# Patient Record
Sex: Female | Born: 1995 | Race: White | Hispanic: No | Marital: Married | Smoking: Never smoker
Health system: Southern US, Community
[De-identification: ages and names within clinical notes are randomized; demographics above are authoritative.]

## PROBLEM LIST (undated history)

## (undated) ENCOUNTER — Inpatient Hospital Stay (HOSPITAL_COMMUNITY): Payer: Self-pay

## (undated) DIAGNOSIS — Z23 Encounter for immunization: Secondary | ICD-10-CM

## (undated) DIAGNOSIS — A159 Respiratory tuberculosis unspecified: Secondary | ICD-10-CM

## (undated) DIAGNOSIS — R519 Headache, unspecified: Secondary | ICD-10-CM

## (undated) DIAGNOSIS — N83209 Unspecified ovarian cyst, unspecified side: Secondary | ICD-10-CM

## (undated) DIAGNOSIS — Z8041 Family history of malignant neoplasm of ovary: Secondary | ICD-10-CM

## (undated) DIAGNOSIS — B999 Unspecified infectious disease: Secondary | ICD-10-CM

## (undated) DIAGNOSIS — D649 Anemia, unspecified: Secondary | ICD-10-CM

## (undated) DIAGNOSIS — N750 Cyst of Bartholin's gland: Secondary | ICD-10-CM

## (undated) DIAGNOSIS — Z227 Latent tuberculosis: Secondary | ICD-10-CM

## (undated) DIAGNOSIS — R51 Headache: Secondary | ICD-10-CM

## (undated) DIAGNOSIS — N200 Calculus of kidney: Secondary | ICD-10-CM

## (undated) DIAGNOSIS — J45909 Unspecified asthma, uncomplicated: Secondary | ICD-10-CM

## (undated) HISTORY — PX: NO PAST SURGERIES: SHX2092

## (undated) HISTORY — DX: Family history of malignant neoplasm of ovary: Z80.41

## (undated) HISTORY — DX: Cyst of Bartholin's gland: N75.0

## (undated) HISTORY — DX: Unspecified ovarian cyst, unspecified side: N83.209

## (undated) HISTORY — DX: Encounter for immunization: Z23

---
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
## 2015-01-16 ENCOUNTER — Other Ambulatory Visit: Payer: Self-pay | Admitting: Occupational Medicine

## 2015-01-16 ENCOUNTER — Ambulatory Visit
Admission: RE | Admit: 2015-01-16 | Discharge: 2015-01-16 | Disposition: A | Discharge status: Home / Self Care | Payer: Self-pay | Source: Ambulatory Visit | Attending: Occupational Medicine | Admitting: Occupational Medicine

## 2015-01-16 DIAGNOSIS — R7611 Nonspecific reaction to tuberculin skin test without active tuberculosis: Secondary | ICD-10-CM

## 2016-03-20 ENCOUNTER — Encounter (HOSPITAL_BASED_OUTPATIENT_CLINIC_OR_DEPARTMENT_OTHER): Payer: Self-pay | Admitting: *Deleted

## 2016-03-20 ENCOUNTER — Emergency Department (HOSPITAL_BASED_OUTPATIENT_CLINIC_OR_DEPARTMENT_OTHER): Payer: BLUE CROSS/BLUE SHIELD

## 2016-03-20 ENCOUNTER — Emergency Department (HOSPITAL_BASED_OUTPATIENT_CLINIC_OR_DEPARTMENT_OTHER)
Admission: EM | Admit: 2016-03-20 | Discharge: 2016-03-20 | Disposition: A | Discharge status: Home / Self Care | Payer: BLUE CROSS/BLUE SHIELD | Attending: Physician Assistant | Admitting: Physician Assistant

## 2016-03-20 DIAGNOSIS — R079 Chest pain, unspecified: Secondary | ICD-10-CM | POA: Insufficient documentation

## 2016-03-20 DIAGNOSIS — R072 Precordial pain: Secondary | ICD-10-CM | POA: Diagnosis present

## 2016-03-20 LAB — COMPREHENSIVE METABOLIC PANEL
ALK PHOS: 51 U/L (ref 38–126)
ALT: 13 U/L — ABNORMAL LOW (ref 14–54)
ANION GAP: 5 (ref 5–15)
AST: 16 U/L (ref 15–41)
Albumin: 4.2 g/dL (ref 3.5–5.0)
BILIRUBIN TOTAL: 0.3 mg/dL (ref 0.3–1.2)
BUN: 14 mg/dL (ref 6–20)
CALCIUM: 9.1 mg/dL (ref 8.9–10.3)
CO2: 28 mmol/L (ref 22–32)
Chloride: 105 mmol/L (ref 101–111)
Creatinine, Ser: 0.67 mg/dL (ref 0.44–1.00)
GFR calc non Af Amer: >60 mL/min (ref >60)
Glucose, Bld: 105 mg/dL — ABNORMAL HIGH (ref 65–99)
POTASSIUM: 4.6 mmol/L (ref 3.5–5.1)
SODIUM: 138 mmol/L (ref 135–145)
TOTAL PROTEIN: 8 g/dL (ref 6.5–8.1)

## 2016-03-20 LAB — CBC WITH DIFFERENTIAL/PLATELET
BASOS ABS: 0 10*3/uL (ref 0.0–0.1)
BASOS PCT: 0 %
Eosinophils Absolute: 0.2 10*3/uL (ref 0.0–0.7)
Eosinophils Relative: 2 %
HEMATOCRIT: 41.2 % (ref 36.0–46.0)
HEMOGLOBIN: 14.2 g/dL (ref 12.0–15.0)
LYMPHS PCT: 29 %
Lymphs Abs: 3.2 10*3/uL (ref 0.7–4.0)
MCH: 30.3 pg (ref 26.0–34.0)
MCHC: 34.5 g/dL (ref 30.0–36.0)
MCV: 87.8 fL (ref 78.0–100.0)
Monocytes Absolute: 0.9 10*3/uL (ref 0.1–1.0)
Monocytes Relative: 8 %
NEUTROS ABS: 6.6 10*3/uL (ref 1.7–7.7)
NEUTROS PCT: 61 %
Platelets: 354 10*3/uL (ref 150–400)
RBC: 4.69 MIL/uL (ref 3.87–5.11)
RDW: 13.2 % (ref 11.5–15.5)
WBC: 10.9 10*3/uL — ABNORMAL HIGH (ref 4.0–10.5)

## 2016-03-20 LAB — D-DIMER, QUANTITATIVE (NOT AT ARMC)

## 2016-03-20 LAB — HCG, SERUM, QUALITATIVE: Preg, Serum: NEGATIVE

## 2016-03-20 NOTE — Discharge Instructions (Signed)
You were seen here for shortness of breath and chest pain. All the labs and EKG are are reassuring. Please follow-up with her primary care physician for other concerns.

## 2016-03-20 NOTE — ED Triage Notes (Signed)
Pt c/o med sternal CP and dizziness x 2 days.

## 2016-03-20 NOTE — ED Notes (Signed)
Patient transported to X-ray 

## 2016-03-20 NOTE — ED Provider Notes (Signed)
[facility]   CSN: [id number] Arrival date & time: 03/20/16  1616     History   Chief Complaint Chief Complaint  Patient presents with  . Chest Pain    HPI Lynann BeaverSarah Leanne Friedlander is a 20 y.o. female.  The history is provided by the patient.  Chest Pain   This is a new problem. The current episode started 2 days ago. Episode frequency: intermittently. The problem has not changed since onset.The pain is associated with exertion and movement. The pain is present in the substernal region. The pain is at a severity of 2/10. The pain is mild. The quality of the pain is described as brief, pleuritic and heavy. The pain does not radiate. The symptoms are aggravated by certain positions and deep breathing. Pertinent negatives include no diaphoresis, no dizziness, no near-syncope, no numbness and no palpitations. She has tried nothing for the symptoms. The treatment provided mild relief. There are no known risk factors.  Pertinent negatives for past medical history include no anxiety/panic attacks, no CAD, no cancer, no congenital heart disease, no diabetes, no DVT, no MI, no PE, no PVD and no recent injury.  Pertinent negatives for family medical history include: no CAD and no early MI.    History reviewed. No pertinent past medical history.  There are no active problems to display for this patient.   History reviewed. No pertinent surgical history.  OB History    No data available       Home Medications    Prior to Admission medications   Not on File    Family History History reviewed. No pertinent family history.  Social History Social History  Substance Use Topics  . Smoking status: Never Smoker  . Smokeless tobacco: Not on file  . Alcohol use No     Allergies   Patient has no known allergies.   Review of Systems Review of Systems  Constitutional: Negative for diaphoresis.  Cardiovascular: Positive for chest pain. Negative for  palpitations and near-syncope.  Neurological: Negative for dizziness and numbness.  All other systems reviewed and are negative.    Physical Exam Updated Vital Signs BP 151/83   Pulse 104   Temp 97.7 F (36.5 C)   Resp 18   Ht 5\' 6"  (1.676 m)   Wt 150 lb (68 kg)   LMP 02/29/2016   SpO2 100%   BMI 24.21 kg/m   Physical Exam  Constitutional: She is oriented to person, place, and time. She appears well-developed and well-nourished.  HENT:  Head: Normocephalic and atraumatic.  Eyes: Right eye exhibits no discharge.  Cardiovascular: Normal rate, regular rhythm and normal heart sounds.   No murmur heard. Pulmonary/Chest: Effort normal and breath sounds normal. She has no wheezes. She has no rales.  Abdominal: Soft. She exhibits no distension. There is no tenderness.  Neurological: She is oriented to person, place, and time.  Skin: Skin is warm and dry. She is not diaphoretic.  Psychiatric: She has a normal mood and affect.  Nursing note and vitals reviewed.    ED Treatments / Results  Labs (all labs ordered are listed, but only abnormal results are displayed) Labs Reviewed  COMPREHENSIVE METABOLIC PANEL  CBC WITH DIFFERENTIAL/PLATELET  HCG, SERUM, QUALITATIVE  D-DIMER, QUANTITATIVE (NOT [facility])    EKG  EKG Interpretation None       Radiology No results found.  Procedures Procedures (including critical care time)  Medications Ordered in ED Medications - No data to  display   Initial Impression / Assessment and Plan / ED Course  I have reviewed the triage vital signs and the nursing notes.  Pertinent labs & imaging results that were available during my care of the patient were reviewed by me and considered in my medical decision making (see chart for details).  Clinical Course     Pt is a 20 year old presenting with SOB  intermittently ocassionally since yesterday. Negative review of  symptoms. No recent travel, no OCP. No PE risk factors. However  patient initially tachycardic. We'll do d-dimer. Do not suspect ischemic heart heart disease that she has 0 risk factors and a young age. Will get chest x-ray and labs.  Normal vital signs labs and xray reassuring. Plan to discharge with follow up with PCP.  Final Clinical Impressions(s) / ED Diagnoses   Final diagnoses:  None    New Prescriptions New Prescriptions   No medications on file     Hosea Hanawalt Randall AnLyn Gerilyn Stargell, MD 03/20/16 1928

## 2016-07-12 IMAGING — CR DG CHEST 1V
1 series · 1 of 1 positions shown · non-contrast
Comparison: None.

CLINICAL DATA: Positive tuberculin skin test

EXAM:
CHEST  1 VIEW

[chest pa]
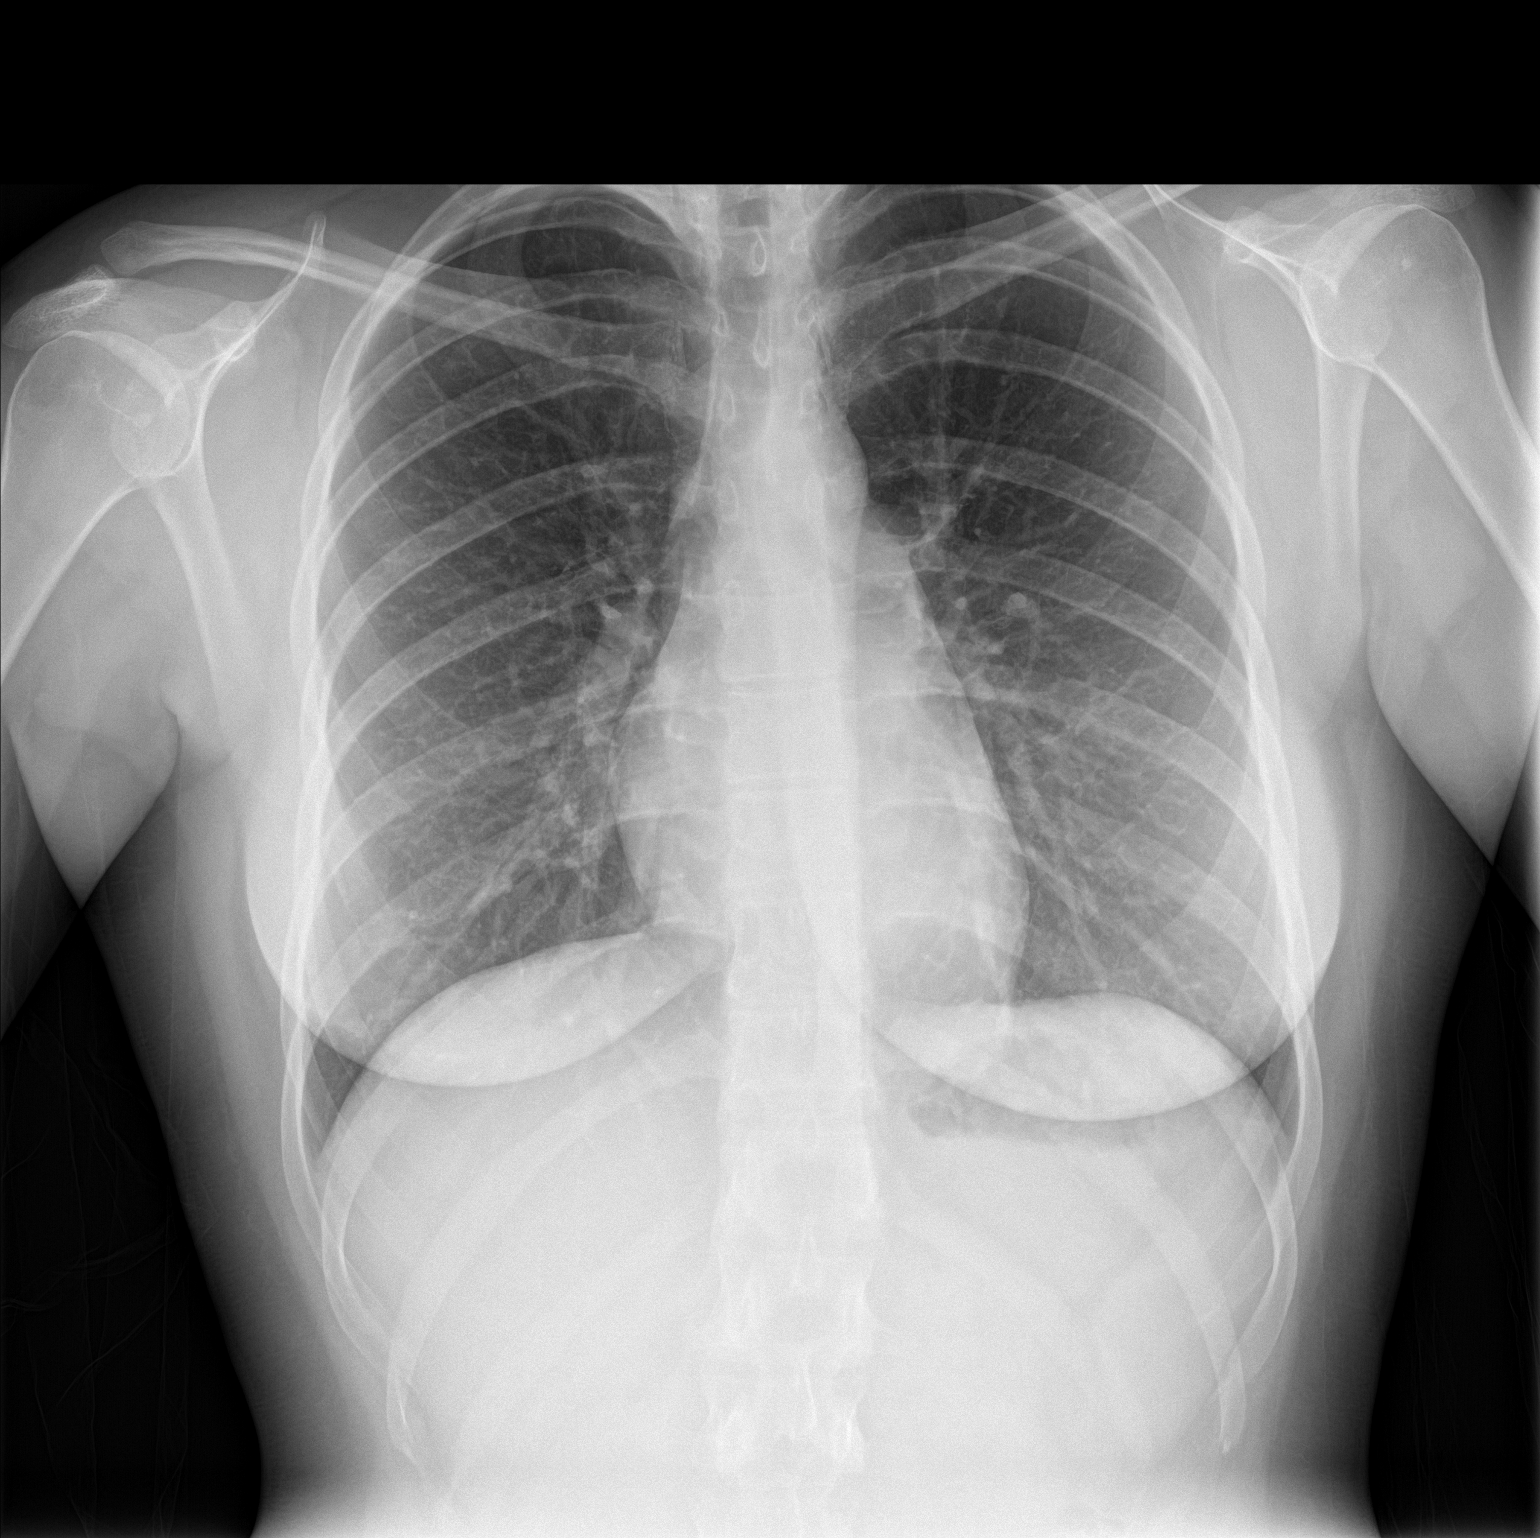

[1 of 1 positions shown; findings below may reference images not displayed]

FINDINGS: Lungs are clear. The heart size and pulmonary vascularity are
normal. No adenopathy. No bone lesions.
IMPRESSION: No abnormality noted.

## 2016-08-14 ENCOUNTER — Ambulatory Visit: Payer: Self-pay | Admitting: Obstetrics and Gynecology

## 2016-08-21 ENCOUNTER — Encounter: Payer: Managed Care, Other (non HMO) | Admitting: Obstetrics and Gynecology

## 2016-08-21 ENCOUNTER — Encounter: Payer: Self-pay | Admitting: Obstetrics and Gynecology

## 2016-08-22 NOTE — Progress Notes (Signed)
This encounter was created in error - please disregard.

## 2016-09-13 ENCOUNTER — Ambulatory Visit: Payer: Managed Care, Other (non HMO) | Admitting: Obstetrics and Gynecology

## 2016-09-13 NOTE — Progress Notes (Deleted)
   No chief complaint on file.    HPI:      [name] is a 21 y.o. G0P0000 who LMP was No LMP recorded., presents today for her NP annual examination.  Her menses are {norm/abn:715}, lasting {number:22536} days.  Dysmenorrhea {dysmen:716}. She {does:18564} have intermenstrual bleeding.  Sex activity: {sex active:315163}.  Hx of STDs: {STD hx:14358}  There is no FH of breast cancer. There is no FH of ovarian cancer. The patient {does:18564} do self-breast exams.  Tobacco use: {tob:20664} Alcohol use: {Alcohol:11675} Exercise: {exercise:31265}  She {does:18564} get adequate calcium and Vitamin D in her diet.  Gardasil series completed.  Past Medical History:  Diagnosis Date  . Family history of ovarian cancer    PT CANDIDATE FOR GENETIC TESTING AT AGE 32  . Immunization, viral disease    GARDASIL COMPLETED  . Ovarian cyst 05/2012   RUPTURED; SAW URGENT CARE    No past surgical history on file.  Family History  Problem Relation Age of Onset  . Cancer Paternal Grandmother     Social History   Social History  . Marital status: Single    Spouse name: N/A  . Number of children: N/A  . Years of education: N/A   Occupational History  . Not on file.   Social History Main Topics  . Smoking status: Never Smoker  . Smokeless tobacco: Never Used  . Alcohol use No  . Drug use: No  . Sexual activity: Yes    Birth control/ protection: None, Pill   Other Topics Concern  . Not on file   Social History Narrative  . No narrative on file     Current Outpatient Prescriptions:  .  cephALEXin (KEFLEX) 500 MG capsule, , Disp: , Rfl:  .  norethindrone-ethinyl estradiol (MICROGESTIN FE 1/20) 1-20 MG-MCG tablet, Take 1 tablet by mouth daily., Disp: , Rfl:   ROS:  ROS  Objective: There were no vitals taken for this visit.   OBGyn Exam  Results: No results found for this or any previous visit (from the past 24 hour(s)).  Assessment/Plan: No  diagnosis found.             GYN counsel {counseling:16159}     F/U  No Follow-up on file.  Alicia B. Copland, PA-C 09/13/2016 8:54 AM

## 2017-05-06 NOTE — L&D Delivery Note (Signed)
[facility] Delivery Note  Melanie BeaverSarah Leanne Meadows is a 22 y.o. G2P0010 s/p SVD at 656w6d. She was admitted for PROM.   ROM: 22h 7864m with clear fluid GBS Status: Negative Maximum Maternal Temperature: 98.4  Labor Progress: . Augmented with cytotec and FB   Delivery Date/Time: [phone] Delivery: Called to room and patient was complete and pushing. Head delivered ROA. Nuchal cord present x1. Shoulder and body delivered in usual fashion. Infant with spontaneous cry, placed on mother's abdomen, dried and stimulated. Cord clamped x 2 after 1-minute delay, and cut by FOB. Cord blood drawn. Placenta delivered spontaneously with gentle cord traction. Fundus firm with massage and Pitocin. Labia, perineum, vagina, and cervix inspected inspected with R labial laceration noted, one 3.0 Monocryl interrupted stich placed with hemostasis afterwards.   Placenta: intact Complications: none Lacerations: R labial EBL: 467ml  Infant: vigorous female  APGARs 679 and 559  3225g  Burman NievesJulia Vernor Monnig, MD Family Medicine Resident

## 2017-07-24 ENCOUNTER — Ambulatory Visit (INDEPENDENT_AMBULATORY_CARE_PROVIDER_SITE_OTHER): Payer: Managed Care, Other (non HMO) | Admitting: *Deleted

## 2017-07-24 DIAGNOSIS — Z3201 Encounter for pregnancy test, result positive: Secondary | ICD-10-CM

## 2017-07-24 DIAGNOSIS — Z3687 Encounter for antenatal screening for uncertain dates: Secondary | ICD-10-CM

## 2017-07-24 NOTE — Progress Notes (Signed)
PRENATAL INTAKE SUMMARY  Melanie Meadows presents today New OB Nurse Interview.  OB History    Gravida  2   Para  0   Term  0   Preterm  0   AB  1   Living  0     SAB  1   TAB  0   Ectopic  0   Multiple  0   Live Births  0          I have reviewed the patient's medical, obstetrical, social, and family histories, medications, and available lab results.  SUBJECTIVE She has no unusual complaints  OBJECTIVE Initial Physical Exam (New OB)  GENERAL APPEARANCE: alert, well appearing, in no apparent distress, oriented to person, place and time   ASSESSMENT Positive UPT and Gestational Sac seen on bedside US Early Pregnancy   PL[facility] If viability confirmed on next bedside US will order New OB baseline labs Pt to return in 1 week for viability scan    DATING AND VIABILITY SONOGRAM   Melanie Meadows is a 22 y.o. year old G2P0010 with LMP Patient's last menstrual period was 06/20/2017. which would correlate to  5932w6d weeks gestation.  She has regular menstrual cycles.   She is here today for a confirmatory initial sonogram.    GESTATION: Undertermined     FETAL ACTIVITY: Heart rate: Not seen          The fetus is Not Seen.  GESTATIONAL AGE AND  BIOMETRICS:  Gestational criteria: Estimated Date of Delivery: 03/27/18 by LMP now at 6732w6d  Previous Scans:0  GESTATIONAL SAC           0.397cm 5.0 wks      AVERAGE EGA(BY THIS SCAN):  5.0 weeks  WORKING EDD( LMP ):  03/27/18     TECHNICIAN COMMENTS:  Gestational Sac measuring 5.0 wks. Pt is to come back in 1 week for another viability scan  [facility] Aleeta Schmaltz 07/24/2017 10:34 AM

## 2017-07-29 ENCOUNTER — Encounter: Payer: Self-pay | Admitting: Obstetrics & Gynecology

## 2017-07-31 ENCOUNTER — Ambulatory Visit: Payer: Managed Care, Other (non HMO) | Admitting: *Deleted

## 2017-07-31 DIAGNOSIS — Z3201 Encounter for pregnancy test, result positive: Secondary | ICD-10-CM

## 2017-07-31 DIAGNOSIS — Z3480 Encounter for supervision of other normal pregnancy, unspecified trimester: Secondary | ICD-10-CM

## 2017-07-31 NOTE — Progress Notes (Signed)
Patient presented to office today for viability scan and lab work. Ultrasound was perform and heart rate of baby was 114. Patient is requesting we do a blood test(hcg) level since she has not had one with this pregnancy and has a history of having miscarriage. Patient does not want any generic testing or the ob panel today due to cost and would prefer to wait a couple more weeks.Labs were canceled and hcg was ordered. Advised patient we will call her once the results come back. Patient voice understanding.

## 2017-08-01 ENCOUNTER — Encounter: Payer: Self-pay | Admitting: *Deleted

## 2017-08-01 LAB — BETA HCG QUANT (REF LAB): HCG QUANT: 11125 m[IU]/mL

## 2017-08-01 NOTE — Progress Notes (Signed)
Pt came in office 07/31/17 for viability scan.  DATING AND VIABILITY SONOGRAM   Melanie Meadows is a 22 y.o. year old G2P0010 with LMP 06/20/2017 which would correlate to  6361w0d weeks gestation.  She has regular menstrual cycles.   She is here today for a confirmatory initial sonogram.    GESTATION: SINGLETON     FETAL ACTIVITY:          Heart rate         114bpm          The fetus is current.    GESTATIONAL AGE AND  BIOMETRICS:  Gestational criteria: Estimated Date of Delivery: 03/27/18 by LMP now at 8761w0d  Previous Scans:1  Yolk Sac            0.394cm   CROWN RUMP LENGTH 0.391cm 6.0                                                   AVERAGE EGA(BY THIS SCAN):  6.0 weeks  WORKING EDD( LMP ):  03/27/18     TECHNICIAN COMMENTS:  Yolk Sac and Fetal pole seen on US measuring 6.0wks with FHR 114bpm.     Mandy Hutchinson 08/01/2017 8:53 AM

## 2017-08-01 NOTE — Progress Notes (Signed)
Pt was here for viability scan to follow up from the one completed last week. If FHR was seen, per V/O from Dr. Vergie LivingPickens, New OB labs can be drawn before patients insurance runs out at the end of the month.    Viability scan completed and New OB labs ordered.  Pt states she would like baseline labs drawn today and understands treatment plan of coming back for New OB with MD around 8-[redacted] weeks GA.

## 2017-08-06 NOTE — Progress Notes (Signed)
I have reviewed the chart and agree with nursing staff's documentation of this patient's encounter.  [place] Bingharlie Kore Madlock, MD 08/06/2017 11:26 AM

## 2017-09-10 ENCOUNTER — Encounter: Payer: Self-pay | Admitting: Student

## 2017-09-10 ENCOUNTER — Other Ambulatory Visit (HOSPITAL_COMMUNITY)
Admission: RE | Admit: 2017-09-10 | Discharge: 2017-09-10 | Disposition: A | Discharge status: Home / Self Care | Payer: Managed Care, Other (non HMO) | Source: Ambulatory Visit | Attending: Family Medicine | Admitting: Family Medicine

## 2017-09-10 ENCOUNTER — Ambulatory Visit (INDEPENDENT_AMBULATORY_CARE_PROVIDER_SITE_OTHER): Payer: Managed Care, Other (non HMO) | Admitting: Student

## 2017-09-10 VITALS — BP 111/75 | HR 94 | Wt 144.0 lb

## 2017-09-10 DIAGNOSIS — N750 Cyst of Bartholin's gland: Secondary | ICD-10-CM

## 2017-09-10 DIAGNOSIS — Z3481 Encounter for supervision of other normal pregnancy, first trimester: Secondary | ICD-10-CM | POA: Diagnosis not present

## 2017-09-10 DIAGNOSIS — Z3493 Encounter for supervision of normal pregnancy, unspecified, third trimester: Secondary | ICD-10-CM | POA: Insufficient documentation

## 2017-09-10 DIAGNOSIS — R7611 Nonspecific reaction to tuberculin skin test without active tuberculosis: Secondary | ICD-10-CM

## 2017-09-10 DIAGNOSIS — Z3480 Encounter for supervision of other normal pregnancy, unspecified trimester: Secondary | ICD-10-CM

## 2017-09-10 DIAGNOSIS — Z227 Latent tuberculosis: Secondary | ICD-10-CM | POA: Insufficient documentation

## 2017-09-10 HISTORY — DX: Cyst of Bartholin's gland: N75.0

## 2017-09-10 NOTE — Patient Instructions (Signed)
First Trimester of Pregnancy The first trimester of pregnancy is from week 1 until the end of week 13 (months 1 through 3). A week after a sperm fertilizes an egg, the egg will implant on the wall of the uterus. This embryo will begin to develop into a baby. Genes from you and your partner will form the baby. The female genes will determine whether the baby will be a boy or a girl. At 6-8 weeks, the eyes and face will be formed, and the heartbeat can be seen on ultrasound. At the end of 12 weeks, all the baby's organs will be formed. Now that you are pregnant, you will want to do everything you can to have a healthy baby. Two of the most important things are to get good prenatal care and to follow your health care provider's instructions. Prenatal care is all the medical care you receive before the baby's birth. This care will help prevent, find, and treat any problems during the pregnancy and childbirth. Body changes during your first trimester Your body goes through many changes during pregnancy. The changes vary from woman to woman.  You may gain or lose a couple of pounds at first.  You may feel sick to your stomach (nauseous) and you may throw up (vomit). If the vomiting is uncontrollable, call your health care provider.  You may tire easily.  You may develop headaches that can be relieved by medicines. All medicines should be approved by your health care provider.  You may urinate more often. Painful urination may mean you have a bladder infection.  You may develop heartburn as a result of your pregnancy.  You may develop constipation because certain hormones are causing the muscles that push stool through your intestines to slow down.  You may develop hemorrhoids or swollen veins (varicose veins).  Your breasts may begin to grow larger and become tender. Your nipples may stick out more, and the tissue that surrounds them (areola) may become darker.  Your gums may bleed and may be  sensitive to brushing and flossing.  Dark spots or blotches (chloasma, mask of pregnancy) may develop on your face. This will likely fade after the baby is born.  Your menstrual periods will stop.  You may have a loss of appetite.  You may develop cravings for certain kinds of food.  You may have changes in your emotions from day to day, such as being excited to be pregnant or being concerned that something may go wrong with the pregnancy and baby.  You may have more vivid and strange dreams.  You may have changes in your hair. These can include thickening of your hair, rapid growth, and changes in texture. Some women also have hair loss during or after pregnancy, or hair that feels dry or thin. Your hair will most likely return to normal after your baby is born.  What to expect at prenatal visits During a routine prenatal visit:  You will be weighed to make sure you and the baby are growing normally.  Your blood pressure will be taken.  Your abdomen will be measured to track your baby's growth.  The fetal heartbeat will be listened to between weeks 10 and 14 of your pregnancy.  Test results from any previous visits will be discussed.  Your health care provider may ask you:  How you are feeling.  If you are feeling the baby move.  If you have had any abnormal symptoms, such as leaking fluid, bleeding, severe headaches,   or abdominal cramping.  If you are using any tobacco products, including cigarettes, chewing tobacco, and electronic cigarettes.  If you have any questions.  Other tests that may be performed during your first trimester include:  Blood tests to find your blood type and to check for the presence of any previous infections. The tests will also be used to check for low iron levels (anemia) and protein on red blood cells (Rh antibodies). Depending on your risk factors, or if you previously had diabetes during pregnancy, you may have tests to check for high blood  sugar that affects pregnant women (gestational diabetes).  Urine tests to check for infections, diabetes, or protein in the urine.  An ultrasound to confirm the proper growth and development of the baby.  Fetal screens for spinal cord problems (spina bifida) and Down syndrome.  HIV (human immunodeficiency virus) testing. Routine prenatal testing includes screening for HIV, unless you choose not to have this test.  You may need other tests to make sure you and the baby are doing well.  Follow these instructions at home: Medicines  Follow your health care provider's instructions regarding medicine use. Specific medicines may be either safe or unsafe to take during pregnancy.  Take a prenatal vitamin that contains at least 600 micrograms (mcg) of folic acid.  If you develop constipation, try taking a stool softener if your health care provider approves. Eating and drinking  Eat a balanced diet that includes fresh fruits and vegetables, whole grains, good sources of protein such as meat, eggs, or tofu, and low-fat dairy. Your health care provider will help you determine the amount of weight gain that is right for you.  Avoid raw meat and uncooked cheese. These carry germs that can cause birth defects in the baby.  Eating four or five small meals rather than three large meals a day may help relieve nausea and vomiting. If you start to feel nauseous, eating a few soda crackers can be helpful. Drinking liquids between meals, instead of during meals, also seems to help ease nausea and vomiting.  Limit foods that are high in fat and processed sugars, such as fried and sweet foods.  To prevent constipation: ? Eat foods that are high in fiber, such as fresh fruits and vegetables, whole grains, and beans. ? Drink enough fluid to keep your urine clear or pale yellow. Activity  Exercise only as directed by your health care provider. Most women can continue their usual exercise routine during  pregnancy. Try to exercise for 30 minutes at least 5 days a week. Exercising will help you: ? Control your weight. ? Stay in shape. ? Be prepared for labor and delivery.  Experiencing pain or cramping in the lower abdomen or lower back is a good sign that you should stop exercising. Check with your health care provider before continuing with normal exercises.  Try to avoid standing for long periods of time. Move your legs often if you must stand in one place for a long time.  Avoid heavy lifting.  Wear low-heeled shoes and practice good posture.  You may continue to have sex unless your health care provider tells you not to. Relieving pain and discomfort  Wear a good support bra to relieve breast tenderness.  Take warm sitz baths to soothe any pain or discomfort caused by hemorrhoids. Use hemorrhoid cream if your health care provider approves.  Rest with your legs elevated if you have leg cramps or low back pain.  If you develop   varicose veins in your legs, wear support hose. Elevate your feet for 15 minutes, 3-4 times a day. Limit salt in your diet. Prenatal care  Schedule your prenatal visits by the twelfth week of pregnancy. They are usually scheduled monthly at first, then more often in the last 2 months before delivery.  Write down your questions. Take them to your prenatal visits.  Keep all your prenatal visits as told by your health care provider. This is important. Safety  Wear your seat belt at all times when driving.  Make a list of emergency phone numbers, including numbers for family, friends, the hospital, and police and fire departments. General instructions  Ask your health care provider for a referral to a local prenatal education class. Begin classes no later than the beginning of month 6 of your pregnancy.  Ask for help if you have counseling or nutritional needs during pregnancy. Your health care provider can offer advice or refer you to specialists for help  with various needs.  Do not use hot tubs, steam rooms, or saunas.  Do not douche or use tampons or scented sanitary pads.  Do not cross your legs for long periods of time.  Avoid cat litter boxes and soil used by cats. These carry germs that can cause birth defects in the baby and possibly loss of the fetus by miscarriage or stillbirth.  Avoid all smoking, herbs, alcohol, and medicines not prescribed by your health care provider. Chemicals in these products affect the formation and growth of the baby.  Do not use any products that contain nicotine or tobacco, such as cigarettes and e-cigarettes. If you need help quitting, ask your health care provider. You may receive counseling support and other resources to help you quit.  Schedule a dentist appointment. At home, brush your teeth with a soft toothbrush and be gentle when you floss. Contact a health care provider if:  You have dizziness.  You have mild pelvic cramps, pelvic pressure, or nagging pain in the abdominal area.  You have persistent nausea, vomiting, or diarrhea.  You have a bad smelling vaginal discharge.  You have pain when you urinate.  You notice increased swelling in your face, hands, legs, or ankles.  You are exposed to fifth disease or chickenpox.  You are exposed to German measles (rubella) and have never had it. Get help right away if:  You have a fever.  You are leaking fluid from your vagina.  You have spotting or bleeding from your vagina.  You have severe abdominal cramping or pain.  You have rapid weight gain or loss.  You vomit blood or material that looks like coffee grounds.  You develop a severe headache.  You have shortness of breath.  You have any kind of trauma, such as from a fall or a car accident. Summary  The first trimester of pregnancy is from week 1 until the end of week 13 (months 1 through 3).  Your body goes through many changes during pregnancy. The changes vary from  woman to woman.  You will have routine prenatal visits. During those visits, your health care provider will examine you, discuss any test results you may have, and talk with you about how you are feeling. This information is not intended to replace advice given to you by your health care provider. Make sure you discuss any questions you have with your health care provider. Document Released: 04/16/2001 Document Revised: 04/03/2016 Document Reviewed: 04/03/2016 Elsevier Interactive Patient Education  2018 [employer]  

## 2017-09-10 NOTE — Progress Notes (Signed)
  Subjective:    Melanie Meadows is being seen today for her first obstetrical visit.  This is a planned pregnancy. She is at [redacted]w[redacted]d gestation. Her obstetrical history is significant for nothing. . Relationship with FOB: significant other, living together. Patient does intend to breast feed. Pregnancy history fully reviewed.  Patient reports no complaints. 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  Completed one course of Rifampin (one month); then she was pregnant and she was told by [facility] not to continue the regimen. She does not remember when this was or what the follow-up was supposed to be.   Review of Systems:   Review of Systems  Constitutional: Negative.   HENT: Negative.   Eyes: Negative.   Respiratory: Negative.   Gastrointestinal: Negative.   Genitourinary: Negative.   Musculoskeletal: Negative.   Neurological: Negative.   Hematological: Negative.   Psychiatric/Behavioral: Negative.     Objective:     BP 111/75   Pulse 94   Wt 144 lb (65.3 kg)   LMP 06/20/2017   BMI 23.24 kg/m  Physical Exam  Constitutional: She is oriented to person, place, and time. She appears well-developed and well-nourished.  HENT:  Head: Normocephalic.  Eyes: Pupils are equal, round, and reactive to light.  Neck: Normal range of motion.  Respiratory: Effort normal.  GI: Soft.  Genitourinary:  Genitourinary Comments: Normal external female genitalia; left bartholin's gland cyst present which is non-tender to the touch. Cervix is pink with no lesions; no vaginal discharge, no CMT, adnexal tenderness or suprapubic tenderness.    Musculoskeletal: Normal range of motion.  Neurological: She is alert and oriented to person, place, and time.  Skin: Skin is warm and dry.  Psychiatric: She has a normal mood and affect.    Exam    Assessment:    Pregnancy:  G2P0010 Patient Active Problem List   Diagnosis Date Noted  . Encounter for supervision of normal pregnancy, unspecified, unspecified trimester 09/10/2017  . 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 Bartholin's cyst 09/10/2017       Plan:     Initial labs drawn. Prenatal vitamins. Problem list reviewed and updated. AFP3 discussed: declined. Role of ultrasound in pregnancy discussed; fetal survey: will order at next visit. . Amniocentesis discussed: not indicated. Follow up in 4 weeks. 50% of 30 min visit spent on counseling and coordination of care.  Oriented and welcomed patient to practice; explained role of NPs, PAs, CNM, MDs and students.  Will consult with attending about latent TB and let her know if anything changes regarding her treatment.  Discussed Bartholins' cyst; if it becomes painful or infected she should call the office to be seen for incision and drainage. Recommend warm compresses now. Patient is not concerned about it and does not want any treatment for it today.   Charlesetta Garibaldi [facility] 09/10/2017

## 2017-09-11 NOTE — Addendum Note (Signed)
Addended by: Cheree Ditto, DEMETRICE A on: 09/11/2017 10:55 AM   Modules accepted: Kipp Brood

## 2017-09-11 NOTE — Addendum Note (Signed)
Addended by: Cheree Ditto, DEMETRICE A on: 09/11/2017 11:38 AM   Modules accepted: Orders

## 2017-09-12 ENCOUNTER — Telehealth: Payer: Self-pay | Admitting: Radiology

## 2017-09-12 LAB — OBSTETRIC PANEL, INCLUDING HIV
Antibody Screen: NEGATIVE
BASOS ABS: 0 10*3/uL (ref 0.0–0.2)
Basos: 0 %
EOS (ABSOLUTE): 0.2 10*3/uL (ref 0.0–0.4)
Eos: 1 %
HIV Screen 4th Generation wRfx: NONREACTIVE
Hematocrit: 38.8 % (ref 34.0–46.6)
Hemoglobin: 12.8 g/dL (ref 11.1–15.9)
Hepatitis B Surface Ag: NEGATIVE
IMMATURE GRANS (ABS): 0 10*3/uL (ref 0.0–0.1)
Immature Granulocytes: 0 %
LYMPHS: 20 %
Lymphocytes Absolute: 3 10*3/uL (ref 0.7–3.1)
MCH: 29.4 pg (ref 26.6–33.0)
MCHC: 33 g/dL (ref 31.5–35.7)
MCV: 89 fL (ref 79–97)
MONOCYTES: 7 %
MONOS ABS: 1 10*3/uL — AB (ref 0.1–0.9)
NEUTROS PCT: 72 %
Neutrophils Absolute: 10.6 10*3/uL — ABNORMAL HIGH (ref 1.4–7.0)
PLATELETS: 368 10*3/uL (ref 150–379)
RBC: 4.36 x10E6/uL (ref 3.77–5.28)
RDW: 13.5 % (ref 12.3–15.4)
RPR Ser Ql: NONREACTIVE
RUBELLA: 1.11 {index} (ref >0.99)
Rh Factor: POSITIVE
WBC: 14.9 10*3/uL — ABNORMAL HIGH (ref 3.4–10.8)

## 2017-09-12 LAB — CYTOLOGY - PAP: DIAGNOSIS: NEGATIVE

## 2017-09-12 LAB — CULTURE, OB URINE

## 2017-09-12 LAB — URINE CULTURE, OB REFLEX

## 2017-09-12 NOTE — Telephone Encounter (Signed)
Left message on the cell phone voicemail about MFM consult for 10/09/17 @ 3:00. Left phone number for for patient to reschedule if needed

## 2017-09-16 LAB — CYSTIC FIBROSIS MUTATION 97: GENE DIS ANAL CARRIER INTERP BLD/T-IMP: NOT DETECTED

## 2017-09-16 LAB — HEMOGLOBINOPATHY EVALUATION
HGB C: 0 %
HGB S: 0 %
HGB VARIANT: 0 %
Hemoglobin A2 Quantitation: 2.4 % (ref 1.8–3.2)
Hemoglobin F Quantitation: 0 % (ref 0.0–2.0)
Hgb A: 97.6 % (ref 96.4–98.8)

## 2017-10-07 ENCOUNTER — Ambulatory Visit (INDEPENDENT_AMBULATORY_CARE_PROVIDER_SITE_OTHER): Payer: Managed Care, Other (non HMO) | Admitting: Family Medicine

## 2017-10-07 ENCOUNTER — Encounter (HOSPITAL_COMMUNITY): Payer: Self-pay | Admitting: Obstetrics and Gynecology

## 2017-10-07 ENCOUNTER — Encounter: Payer: Self-pay | Admitting: Family Medicine

## 2017-10-07 ENCOUNTER — Encounter (HOSPITAL_COMMUNITY): Payer: Self-pay

## 2017-10-07 ENCOUNTER — Ambulatory Visit (HOSPITAL_COMMUNITY)
Admission: RE | Admit: 2017-10-07 | Discharge: 2017-10-07 | Disposition: A | Discharge status: Home / Self Care | Payer: Managed Care, Other (non HMO) | Source: Ambulatory Visit | Attending: Student | Admitting: Student

## 2017-10-07 ENCOUNTER — Other Ambulatory Visit (HOSPITAL_COMMUNITY): Payer: Self-pay | Admitting: Obstetrics and Gynecology

## 2017-10-07 VITALS — BP 103/70 | HR 94 | Wt 147.0 lb

## 2017-10-07 DIAGNOSIS — Z3A15 15 weeks gestation of pregnancy: Secondary | ICD-10-CM | POA: Insufficient documentation

## 2017-10-07 DIAGNOSIS — J4520 Mild intermittent asthma, uncomplicated: Secondary | ICD-10-CM

## 2017-10-07 DIAGNOSIS — R7611 Nonspecific reaction to tuberculin skin test without active tuberculosis: Secondary | ICD-10-CM | POA: Insufficient documentation

## 2017-10-07 DIAGNOSIS — O26891 Other specified pregnancy related conditions, first trimester: Secondary | ICD-10-CM | POA: Insufficient documentation

## 2017-10-07 DIAGNOSIS — Z3490 Encounter for supervision of normal pregnancy, unspecified, unspecified trimester: Secondary | ICD-10-CM

## 2017-10-07 MED ORDER — ALBUTEROL SULFATE HFA 108 (90 BASE) MCG/ACT IN AERS
2.0000 | INHALATION_SPRAY | Freq: Four times a day (QID) | RESPIRATORY_TRACT | 2 refills | Status: DC | PRN
Start: 2017-10-07 — End: 2022-03-22

## 2017-10-07 MED ORDER — PEAK FLOW METER DEVI: 0 refills | Status: DC

## 2017-10-07 NOTE — Progress Notes (Signed)
NIPS drawn today

## 2017-10-07 NOTE — Progress Notes (Signed)
   PRENATAL VISIT NOTE  Subjective:  Melanie Meadows is a 22 y.o. G2P0010 at 3846w4d being seen today for ongoing prenatal care.  She is currently monitored for the following issues for this low-risk pregnancy and has Encounter for supervision of normal pregnancy, unspecified, unspecified trimester; TB lung, latent; Bartholin's cyst; and Asthma, mild intermittent on their problem list.  Patient reports no complaints.  Contractions: Not present. Vag. Bleeding: None.  Movement: Absent. Denies leaking of fluid.   The following portions of the patient's history were reviewed and updated as appropriate: allergies, current medications, past family history, past medical history, past social history, past surgical history and problem list. Problem list updated.  Objective:   Vitals:   10/07/17 1607  BP: 103/70  Pulse: 94  Weight: 147 lb (66.7 kg)    Fetal Status: Fetal Heart Rate (bpm): 140   Movement: Absent     General:  Alert, oriented and cooperative. Patient is in no acute distress.  Skin: Skin is warm and dry. No rash noted.   Cardiovascular: Normal heart rate noted  Respiratory: Normal respiratory effort, no problems with respiration noted  Abdomen: Soft, gravid, appropriate for gestational age.  Pain/Pressure: Absent     Pelvic: Cervical exam deferred        Extremities: Normal range of motion.  Edema: None  Mental Status: Normal mood and affect. Normal behavior. Normal judgment and thought content.   Assessment and Plan:  Pregnancy: G2P0010 at 2346w4d  1. Encounter for supervision of normal pregnancy, antepartum, unspecified gravidity Schedule anatomy u/s NIPT drawn today - US MFM OB COMP + 14 WK; Future  2. Mild intermittent asthma without complication Has inhaler  General obstetric precautions including but not limited to vaginal bleeding, contractions, leaking of fluid and fetal movement were reviewed in detail with the patient. Please refer to After Visit Summary for  other counseling recommendations.  Return in 1 month (on 11/04/2017).  No future appointments.  Reva Boresanya S Nola Botkins, MD

## 2017-10-07 NOTE — Consult Note (Addendum)
MFM Consultation, Staff Note:  Zakara Parkey presents at [redacted]w[redacted]d for MFM consultation in regard to exposure to TB in 2015 and concern for latent TB in pregnancy.  Review of her records indicate that upon her positive TB test, she took Rifampin for 1.5 months and has 2 negative chest X rays since.  I reviewed her history in context of the CDC guidelines.  Per the CDC website "[employer]."  Referenced 10/07/2017 A common screening tool is utilized to ascertain whether a person with documented TB exposure and latent TB has become active as follows:  "Symptoms of TB depend on where in the body the TB bacteria are growing. TB bacteria usually grow in the lungs (pulmonary TB). TB disease in the lungs may cause symptoms such as a bad cough that lasts 3 weeks or longer  pain in the chest  coughing up blood or sputum (phlegm from deep inside the lungs) Other symptoms of TB disease are weakness or fatigue  weight loss  no appetite  chills  fever  sweating at night Symptoms of TB disease in other parts of the body depend on the area affected. People who have latent TB infection do not feel sick, do not have any symptoms, and cannot spread TB to others."  10/07/2017, she specifically denies cough, coughing up bloody sputum, coughing sputum, fevers, chest pain, weakness, weight loss, lack of appetite, chills, fever or sweating at night.  She does however report fatigue since pregnancy and shortness of breath with exertion.  While I feel that this is likely pregnancy-related and/or allergy and asthma related, from a medico-legal standpoint, it is likely a good precaution to obtain a chest X-ray to ensure that there is no active lung disease that would place her, her pregnancy, and surrounding individuals at risk.  Positive chest XR should prompt immediate referral to infectious disease, maternal-fetal medicine consultation, and institution of appropriate  precautions to avoid exposure of other patients and staff that would ultimately then require prophylaxis.  As stated, I feel she is low risk but would recommend CXR to document at this point there is no active disease to be concerned with.  That being said, she wants to focus on the treatment of her allergies and asthma and if no resolution then proceed with the CXR.  She understands the risks of untreated TB (albeit low suspicion) for her, her fetus, and surrounding people she is in contact with.  I then directed my attention to her asthma and allergies.  She reports that prior to pregnancy she took Zyrtec and was well-controlled.  She was advised by a provider to discontinue the Zyrtec as "benadryl is safer in pregnancy."  Since discontinuation of the Zyrtec, she has suffered from congestion and shortness of breath.  I advised her she could resume taking Zyrtec.  She reports the shortness of breath is mostly on exertion and that she had a history of exercise induced asthma.  She does not have an albuterol inhaler or peak flow meter.  Based on her female gender, Caucasian ethnicity, and height of 66 inches, I would expect a peak flow average about 430cc/puff or 80% threshold of 350.  If she has a peak flow of <350, she should use albuterol (2 puffs) and reassess in 20 minutes.  If after two rescue attempts she remains <350 for peak flow, she should seek medical attention rather than continuing to dose herself at home given risks of repeated Beta-agonist therapy without monitoring in pregnancy (eg, pulmonary edema and  tachycardia).  I asked her to keep a log of her peak flow measurements and number of times/week of needing her rescue inhaler.  If 2x/wk or more, then inhaled corticosteroid (eg, Pulmicort or Advair) are needed as maintenance therapy.  I prescribed the albuterol MDI and peak flow meter and she will follow up with you in the office.  She will resume Zyrtec at home OTC.    Summary of  Recommendations: 1. CXR to screen for TB (low risk but should document given respiratory symptom of dyspnea and known latent TB); if patient refuses she knows it is against medical advice and that she accepts any risks of undiagnosed active TB to her and her fetus.  2. In all likelihood this is allergy and asthma related.  I recommended albuterol MDI prn and peak flow meter as well as resumption of her Zyrtec.  Again if her symptoms do not resolve, I would absolutely insist on her getting the CXR to rule out activation of TB that needs treatment.  If symptoms resolve, continue to treat asthma and allergies.   Time Spent: I spent in excess of 60 minutes in consultation with this patient to review records, evaluate her case, and provide her with an adequate discussion and education.  More than 50% of this time was spent in direct face-to-face counseling.  It was a pleasure seeing your patient in the office today.  Thank you for consultation. Please do not hesitate to contact our service for any further questions.   Louann SjogrenJeffrey Morgan Gaynelle Arabianenney   Kamerin Grumbine, Louann SjogrenJeffrey Morgan, MD, MS, FACOG Assistant Professor Section of Maternal-Fetal Medicine [facility] [employer]

## 2017-10-07 NOTE — Patient Instructions (Signed)

## 2017-10-09 ENCOUNTER — Encounter (HOSPITAL_COMMUNITY): Payer: Managed Care, Other (non HMO)

## 2017-10-21 ENCOUNTER — Ambulatory Visit (INDEPENDENT_AMBULATORY_CARE_PROVIDER_SITE_OTHER): Payer: Managed Care, Other (non HMO) | Admitting: Family Medicine

## 2017-10-21 VITALS — BP 127/82 | HR 72 | Wt 146.6 lb

## 2017-10-21 DIAGNOSIS — R7611 Nonspecific reaction to tuberculin skin test without active tuberculosis: Secondary | ICD-10-CM

## 2017-10-21 DIAGNOSIS — Z3400 Encounter for supervision of normal first pregnancy, unspecified trimester: Secondary | ICD-10-CM

## 2017-10-21 DIAGNOSIS — Z227 Latent tuberculosis: Secondary | ICD-10-CM

## 2017-10-21 DIAGNOSIS — Z3402 Encounter for supervision of normal first pregnancy, second trimester: Secondary | ICD-10-CM

## 2017-10-21 NOTE — Patient Instructions (Signed)

## 2017-10-21 NOTE — Assessment & Plan Note (Signed)
Referral to ID, for intro and discussion about treatment options, post delviery.

## 2017-10-21 NOTE — Progress Notes (Signed)
   PRENATAL VISIT NOTE  Subjective:  Melanie Meadows is a 22 y.o. G2P0010 at [redacted]w[redacted]d being seen today for ongoing prenatal care.  She is currently monitored for the following issues for this low-risk pregnancy and has Encounter for supervision of normal pregnancy, unspecified, unspecified trimester; TB lung, latent; Bartholin's cyst; and Asthma, mild intermittent on their problem list.  Patient reports right lower quadrant pain.  Contractions: Not present.  .  Movement: Present. Denies leaking of fluid.   The following portions of the patient's history were reviewed and updated as appropriate: allergies, current medications, past family history, past medical history, past social history, past surgical history and problem list. Problem list updated.  Objective:   Vitals:   10/21/17 1614  BP: 127/82  Pulse: 72  Weight: 146 lb 9.6 oz (66.5 kg)    Fetal Status: Fetal Heart Rate (bpm): 140   Movement: Present     General:  Alert, oriented and cooperative. Patient is in no acute distress.  Skin: Skin is warm and dry. No rash noted.   Cardiovascular: Normal heart rate noted  Respiratory: Normal respiratory effort, no problems with respiration noted  Abdomen: Soft, gravid, appropriate for gestational age.  Pain/Pressure: Present     Pelvic: Cervical exam deferred        Extremities: Normal range of motion.  Edema: None  Mental Status: Normal mood and affect. Normal behavior. Normal judgment and thought content.   Assessment and Plan:  Pregnancy: G2P0010 at [redacted]w[redacted]d  1. TB lung, latent Referral to ID, for intro and discussion about treatment options, post delviery.  - Ambulatory referral to Infectious Disease  2. Supervision of normal first pregnancy, antepartum Round ligament pain discussed Anatomy u/s scheduled for 11/04/17.  General obstetric precautions including but not limited to vaginal bleeding, contractions, leaking of fluid and fetal movement were reviewed in detail with the  patient. Please refer to After Visit Summary for other counseling recommendations.  Return in 1 month (on 11/18/2017).  Future Appointments  Date Time Provider Department Center  11/04/2017  1:15 PM Tereso NewcomerAnyanwu, Ugonna A, MD [facility]  11/04/2017  3:15 PM [facility] US 4 [facility]    Reva Boresanya S Pratt, MD

## 2017-10-22 ENCOUNTER — Other Ambulatory Visit: Payer: Self-pay

## 2017-10-24 ENCOUNTER — Encounter (HOSPITAL_COMMUNITY): Payer: Self-pay

## 2017-11-04 ENCOUNTER — Ambulatory Visit: Payer: Managed Care, Other (non HMO) | Admitting: Internal Medicine

## 2017-11-04 ENCOUNTER — Encounter: Payer: Managed Care, Other (non HMO) | Admitting: Obstetrics & Gynecology

## 2017-11-04 ENCOUNTER — Ambulatory Visit (HOSPITAL_COMMUNITY): Payer: Managed Care, Other (non HMO)

## 2017-11-07 ENCOUNTER — Encounter: Payer: Self-pay | Admitting: Obstetrics and Gynecology

## 2017-11-07 ENCOUNTER — Encounter: Payer: Managed Care, Other (non HMO) | Admitting: Obstetrics and Gynecology

## 2017-11-10 NOTE — Progress Notes (Signed)
Patient did not keep OB appointment for 11/07/2017.  Melanie Meadows, Jr MD Attending [facility](Faculty Practice)

## 2017-11-12 ENCOUNTER — Ambulatory Visit (HOSPITAL_COMMUNITY)
Admission: RE | Admit: 2017-11-12 | Discharge: 2017-11-12 | Disposition: A | Discharge status: Home / Self Care | Payer: Managed Care, Other (non HMO) | Source: Ambulatory Visit | Attending: Family Medicine | Admitting: Family Medicine

## 2017-11-12 ENCOUNTER — Other Ambulatory Visit: Payer: Self-pay | Admitting: Family Medicine

## 2017-11-12 DIAGNOSIS — Z3689 Encounter for other specified antenatal screening: Secondary | ICD-10-CM

## 2017-11-12 DIAGNOSIS — Z363 Encounter for antenatal screening for malformations: Secondary | ICD-10-CM | POA: Diagnosis not present

## 2017-11-12 DIAGNOSIS — Z3A20 20 weeks gestation of pregnancy: Secondary | ICD-10-CM

## 2017-11-12 DIAGNOSIS — Z3490 Encounter for supervision of normal pregnancy, unspecified, unspecified trimester: Secondary | ICD-10-CM

## 2017-11-13 ENCOUNTER — Ambulatory Visit: Payer: Managed Care, Other (non HMO) | Admitting: Internal Medicine

## 2017-11-15 ENCOUNTER — Encounter (HOSPITAL_COMMUNITY): Payer: Self-pay

## 2017-11-15 ENCOUNTER — Inpatient Hospital Stay (HOSPITAL_COMMUNITY)
Admission: AD | Admit: 2017-11-15 | Discharge: 2017-11-15 | Disposition: A | Discharge status: Home / Self Care | Payer: Managed Care, Other (non HMO) | Source: Ambulatory Visit | Attending: Obstetrics & Gynecology | Admitting: Obstetrics & Gynecology

## 2017-11-15 ENCOUNTER — Inpatient Hospital Stay (HOSPITAL_COMMUNITY): Payer: Managed Care, Other (non HMO)

## 2017-11-15 ENCOUNTER — Other Ambulatory Visit: Payer: Self-pay

## 2017-11-15 DIAGNOSIS — O3482 Maternal care for other abnormalities of pelvic organs, second trimester: Secondary | ICD-10-CM | POA: Insufficient documentation

## 2017-11-15 DIAGNOSIS — R109 Unspecified abdominal pain: Secondary | ICD-10-CM

## 2017-11-15 DIAGNOSIS — R350 Frequency of micturition: Secondary | ICD-10-CM | POA: Diagnosis not present

## 2017-11-15 DIAGNOSIS — O99891 Other specified diseases and conditions complicating pregnancy: Secondary | ICD-10-CM

## 2017-11-15 DIAGNOSIS — N1330 Unspecified hydronephrosis: Secondary | ICD-10-CM

## 2017-11-15 DIAGNOSIS — O26892 Other specified pregnancy related conditions, second trimester: Secondary | ICD-10-CM | POA: Insufficient documentation

## 2017-11-15 DIAGNOSIS — Z3A21 21 weeks gestation of pregnancy: Secondary | ICD-10-CM | POA: Insufficient documentation

## 2017-11-15 DIAGNOSIS — O9989 Other specified diseases and conditions complicating pregnancy, childbirth and the puerperium: Secondary | ICD-10-CM

## 2017-11-15 HISTORY — DX: Respiratory tuberculosis unspecified: A15.9

## 2017-11-15 LAB — URINALYSIS, ROUTINE W REFLEX MICROSCOPIC
Bilirubin Urine: NEGATIVE
GLUCOSE, UA: NEGATIVE mg/dL
Hgb urine dipstick: NEGATIVE
KETONES UR: 5 mg/dL — AB
NITRITE: NEGATIVE
PH: 7 (ref 5.0–8.0)
Protein, ur: 100 mg/dL — AB
SPECIFIC GRAVITY, URINE: 1.015 (ref 1.005–1.030)

## 2017-11-15 MED ORDER — PROMETHAZINE HCL 25 MG/ML IJ SOLN
25.0000 mg | Freq: Once | INTRAMUSCULAR | Status: AC
Start: 2017-11-15 — End: 2017-11-15
  Administered 2017-11-15: 25 mg via INTRAMUSCULAR
  Filled 2017-11-15: qty 1

## 2017-11-15 MED ORDER — NALBUPHINE HCL 10 MG/ML IJ SOLN
10.0000 mg | INTRAMUSCULAR | Status: DC | PRN
  Administered 2017-11-15: 10 mg via INTRAMUSCULAR

## 2017-11-15 MED ORDER — PROMETHAZINE HCL 25 MG/ML IJ SOLN
25.0000 mg | Freq: Once | INTRAMUSCULAR | Status: AC
  Administered 2017-11-15: 25 mg via INTRAMUSCULAR
  Filled 2017-11-15: qty 1

## 2017-11-15 MED ORDER — ONDANSETRON 4 MG PO TBDP: 4.0000 mg | ORAL_TABLET | Freq: Three times a day (TID) | ORAL | 0 refills | Status: DC | PRN

## 2017-11-15 MED ORDER — TAMSULOSIN HCL 0.4 MG PO CAPS: 0.4000 mg | ORAL_CAPSULE | Freq: Every day | ORAL | 4 refills | Status: DC

## 2017-11-15 MED ORDER — LACTATED RINGERS IV BOLUS
1000.0000 mL | Freq: Once | INTRAVENOUS | Status: AC
  Administered 2017-11-15: 1000 mL via INTRAVENOUS

## 2017-11-15 MED ORDER — SODIUM CHLORIDE 0.9 % IV SOLN
2.0000 g | Freq: Once | INTRAVENOUS | Status: AC
  Administered 2017-11-15: 2 g via INTRAVENOUS
  Filled 2017-11-15: qty 2

## 2017-11-15 MED ORDER — KETOROLAC TROMETHAMINE 10 MG PO TABS: 10.0000 mg | ORAL_TABLET | Freq: Four times a day (QID) | ORAL | 0 refills | Status: DC | PRN

## 2017-11-15 MED ORDER — SODIUM CHLORIDE 0.9 % IV SOLN
INTRAVENOUS | Status: DC
  Administered 2017-11-15: 18:00:00 via INTRAVENOUS

## 2017-11-15 MED ORDER — OXYCODONE-ACETAMINOPHEN 5-325 MG PO TABS: 1.0000 | ORAL_TABLET | ORAL | 0 refills | Status: DC | PRN

## 2017-11-15 MED ORDER — CEPHALEXIN 500 MG PO CAPS: 500.0000 mg | ORAL_CAPSULE | Freq: Four times a day (QID) | ORAL | 0 refills | Status: DC

## 2017-11-15 MED ORDER — NALBUPHINE HCL 10 MG/ML IJ SOLN
10.0000 mg | INTRAMUSCULAR | Status: DC | PRN
Start: 2017-11-15 — End: 2017-11-15
  Administered 2017-11-15: 10 mg via INTRAMUSCULAR
  Filled 2017-11-15 (×2): qty 1

## 2017-11-15 NOTE — MAU Note (Signed)
+  right lower back pain Started at 2am and has gotten worse since then Constant; intermittent waves sharp pain Rating pain 6/10  +vaginal pressure  Denies LOF or VB +FM; FHR 128  Vitals:   11/15/17 1423  BP: 109/67  Pulse: (!) 105  Resp: 18  Temp: 97.9 F (36.6 C)  SpO2: 100%

## 2017-11-15 NOTE — Discharge Instructions (Signed)
Hydronephrosis Hydronephrosis is the enlargement of a kidney due to a blockage that stops urine from flowing out of the body. What are the causes? Common causes of this condition include:  A birth (congenital) defect of the kidney.  A congenital defect of the tube through which urine travels (ureter).  Kidney stones.  An enlarged prostate gland.  A tumor.  Cancer of the prostate, bladder, uterus, ovary, or colon.  A blood clot.  What are the signs or symptoms? Symptoms of this condition include:  Pain or discomfort in your side (flank).  Swelling of the abdomen.  Pain in the abdomen.  Nausea and vomiting.  Fever.  Pain while passing urine.  Feeling of urgency to urinate.  Frequent urination.  Infection of the urinary tract.  In some cases, there are no symptoms. How is this diagnosed? This condition may be diagnosed with:  A medical history.  A physical exam.  Blood and urine tests to check kidney function.  Imaging tests, such as an X-ray, ultrasound, CT scan, or MRI.  A test in which a rigid or flexible telescope (cystoscope) is used to view the site of the blockage.  How is this treated? Treatment for this condition depends on where the blockage is located, how long it has been there, and what caused it. The goal of treatment is to remove the blockage. Treatment options include:  A procedure to put in a soft tube to help drain urine.  Antibiotic medicines to treat or prevent infection.  Shock-wave therapy (lithotripsy) to help eliminate kidney stones.  Follow these instructions at home:  Get lots of rest.  Drink enough fluid to keep your urine clear or pale yellow.  If you have a drain in, follow your health care provider's instructions about how to care for it.  Take medicines only as directed by your health care provider.  If you were prescribed an antibiotic medicine, finish all of it even if you start to feel better.  Keep all  follow-up visits as directed by your health care provider. This is important. Contact a health care provider if:  You continue to have symptoms after treatment.  You develop new symptoms.  You have a problem with a drainage device.  Your urine becomes cloudy or bloody.  You have a fever. Get help right away if:  You have severe flank or abdominal pain.  You develop vomiting and are unable to keep fluids down. This information is not intended to replace advice given to you by your health care provider. Make sure you discuss any questions you have with your health care provider. Document Released: 02/17/2007 Document Revised: 09/28/2015 Document Reviewed: 04/18/2014 Elsevier Interactive Patient Education  2018 Elsevier Inc.  

## 2017-11-15 NOTE — MAU Provider Note (Signed)
History   22 year old G2 P0-0-1-0 21 weeks 1 day and with acute right flank pain that started at 2 AM this morning pain is severe per patient she said it is dull continuously with sharp stabbing pains starts in the lower right back and radiates around to the lower right quadrant.  She denies pain with voiding but states she has urinary frequency.  She denies vaginal bleeding or rupture membranes.  CSN: [id number]  Arrival date & time 11/15/17  1340   None     Chief Complaint  Patient presents with  . Back Pain    HPI  Past Medical History:  Diagnosis Date  . Family history of ovarian cancer    PT CANDIDATE FOR GENETIC TESTING AT AGE 10  . Immunization, viral disease    GARDASIL COMPLETED  . Ovarian cyst 05/2012   RUPTURED; SAW URGENT CARE  . Tuberculosis    latent tb     Past Surgical History:  Procedure Laterality Date  . NO PAST SURGERIES      Family History  Problem Relation Age of Onset  . Cancer Paternal Grandmother     Social History   Tobacco Use  . Smoking status: Never Smoker  . Smokeless tobacco: Never Used  Substance Use Topics  . Alcohol use: No  . Drug use: No    OB History    Gravida  2   Para  0   Term  0   Preterm  0   AB  1   Living  0     SAB  1   TAB  0   Ectopic  0   Multiple  0   Live Births  0           Review of Systems  Constitutional: Negative.   HENT: Negative.   Eyes: Negative.   Respiratory: Negative.   Cardiovascular: Negative.   Gastrointestinal: Negative.   Endocrine: Negative.   Genitourinary: Positive for flank pain.  Skin: Negative.   Allergic/Immunologic: Negative.   Neurological: Negative.   Hematological: Negative.   Psychiatric/Behavioral: Negative.     Allergies  Pork-derived products  Home Medications    BP 109/67 (BP Location: Right Arm)   Pulse (!) 105   Temp 97.9 F (36.6 C) (Oral)   Resp 18   Wt 151 lb 1.3 oz (68.5 kg)   LMP 06/20/2017   SpO2 100% Comment: ra  BMI  24.38 kg/m   Physical Exam  Constitutional: She is oriented to person, place, and time. She appears well-developed and well-nourished.  Neck: Normal range of motion.  Cardiovascular: Normal rate, regular rhythm, normal heart sounds and intact distal pulses.  Pulmonary/Chest: Effort normal and breath sounds normal.  Abdominal: Soft. Bowel sounds are normal.  Musculoskeletal: Normal range of motion.  Neurological: She is alert and oriented to person, place, and time.  Skin: Skin is warm and dry.  Psychiatric: She has a normal mood and affect. Her behavior is normal. Judgment and thought content normal.    MAU Course  Procedures (including critical care time)  Labs Reviewed  URINALYSIS, ROUTINE W REFLEX MICROSCOPIC   No results found.   1. Acute right flank pain       MDM  Vital signs are stable.  Patient is in obvious discomfort she is rocking back and forth in the bed holding her right flank and a emesis bag in her hand.  When questioned she states she thinks if she can get the pain to subside  the nausea will get better.  No CVA tenderness. US shows mod right hydronephrosis. POC discussed with Dr. Macon LargeAnyanwu. Will d/c home with rx's to f/u in clinic next week.

## 2017-11-17 LAB — URINE CULTURE

## 2017-11-20 ENCOUNTER — Telehealth: Payer: Self-pay | Admitting: Radiology

## 2017-11-20 NOTE — Telephone Encounter (Signed)
Patient called stating that she was short of breath and HR 150-159, explained that she was at hospital this weekend with kidney issues and didn't really want to return. I explained that we did not have a physician in the office and strongly [facility]. Patient agreed and disconnected.

## 2017-11-21 ENCOUNTER — Ambulatory Visit (INDEPENDENT_AMBULATORY_CARE_PROVIDER_SITE_OTHER): Payer: Managed Care, Other (non HMO) | Admitting: Obstetrics and Gynecology

## 2017-11-21 VITALS — BP 126/74 | HR 114 | Wt 156.6 lb

## 2017-11-21 DIAGNOSIS — Z3480 Encounter for supervision of other normal pregnancy, unspecified trimester: Secondary | ICD-10-CM

## 2017-11-21 DIAGNOSIS — O2342 Unspecified infection of urinary tract in pregnancy, second trimester: Secondary | ICD-10-CM

## 2017-11-21 DIAGNOSIS — Z87898 Personal history of other specified conditions: Secondary | ICD-10-CM

## 2017-11-21 MED ORDER — CEPHALEXIN 250 MG PO CAPS: 250.0000 mg | ORAL_CAPSULE | Freq: Every day | ORAL | 1 refills | Status: DC

## 2017-11-21 NOTE — Progress Notes (Signed)
Prenatal Visit Note Date: 11/21/2017 [facility]-  Subjective:  Melanie Meadows is a 22 y.o. G2P0010 at 4960w0d being seen today for ongoing prenatal care.  She is currently monitored for the following issues for this low-risk pregnancy and has Encounter for supervision of normal pregnancy, unspecified, unspecified trimester; TB lung, latent; Bartholin's cyst; and Asthma, mild intermittent on their problem list.  Still having some right back pain but no systemic s/s. 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   Contractions: Not present. Vag. Bleeding: None.  Movement: (!) Decreased. Denies leaking of fluid.   The following portions of the patient's history were reviewed and updated as appropriate: allergies, current medications, past family history, past medical history, past social history, past surgical history and problem list. Problem list updated.  Objective:   Vitals:   11/21/17 1006  BP: 126/74  Pulse: (!) 114  Weight: 156 lb 9.6 oz (71 kg)    Fetal Status: Fetal Heart Rate (bpm): 154   Movement: (!) Decreased     General:  Alert, oriented and cooperative. Patient is in no acute distress.  Skin: Skin is warm and dry. No rash noted.   Cardiovascular: Normal s1 and s2, no MRGs, HR 90s  Respiratory: Normal respiratory effort, no problems with respiration noted  Abdomen: Soft, gravid, appropriate for gestational age. Pain/Pressure: Absent     Pelvic:  Cervical exam deferred        Extremities: Normal range of motion.  Edema: Trace  Mental Status: Normal mood and affect. Normal behavior. Normal judgment and thought content.  Back: nttp. No cvat b/l Urinalysis:      Assessment and Plan:  Pregnancy: G2P0010 at 160w0d  1. Supervision of other normal pregnancy, antepartum Routine care. Has ID f/u next week - Ambulatory referral to Cardiology - CBC - Basic metabolic panel - Magnesium - TSH  2. History of tachycardia Will check  basic labwork and refer to cardiology for consideration of holter. Pt states she's staying well hydrated and snacking throughout the day - Ambulatory referral to Cardiology - CBC - Basic metabolic panel - Magnesium - TSH  3. UTI Finishing up keflex. Advised her that I'd recommend doing suppression therapy after she finishes out her keflex. Also said okay to do toradol prn before 24wks and okay to d/c flomax  Preterm labor symptoms and general obstetric precautions including but not limited to vaginal bleeding, contractions, leaking of fluid and fetal movement were reviewed in detail with the patient. Please refer to After Visit Summary for other counseling recommendations.  Return in about 1 week (around 11/28/2017) for rob.   [place] BingPickens, Moyinoluwa Dawe, MD

## 2017-11-22 LAB — BASIC METABOLIC PANEL
BUN/Creatinine Ratio: 13 (ref 9–23)
BUN: 6 mg/dL (ref 6–20)
CALCIUM: 8.7 mg/dL (ref 8.7–10.2)
CO2: 21 mmol/L (ref 20–29)
Chloride: 104 mmol/L (ref 96–106)
Creatinine, Ser: 0.47 mg/dL — ABNORMAL LOW (ref 0.57–1.00)
GFR calc Af Amer: 161 mL/min/{1.73_m2} (ref >59)
GFR, EST NON AFRICAN AMERICAN: 140 mL/min/{1.73_m2} (ref >59)
Glucose: 98 mg/dL (ref 65–99)
POTASSIUM: 4.2 mmol/L (ref 3.5–5.2)
Sodium: 137 mmol/L (ref 134–144)

## 2017-11-22 LAB — TSH: TSH: 1.87 u[IU]/mL (ref 0.450–4.500)

## 2017-11-22 LAB — CBC
HEMATOCRIT: 32 % — AB (ref 34.0–46.6)
HEMOGLOBIN: 10.7 g/dL — AB (ref 11.1–15.9)
MCH: 29.8 pg (ref 26.6–33.0)
MCHC: 33.4 g/dL (ref 31.5–35.7)
MCV: 89 fL (ref 79–97)
Platelets: 295 10*3/uL (ref 150–450)
RBC: 3.59 x10E6/uL — ABNORMAL LOW (ref 3.77–5.28)
RDW: 13 % (ref 12.3–15.4)
WBC: 11.1 10*3/uL — ABNORMAL HIGH (ref 3.4–10.8)

## 2017-11-22 LAB — MAGNESIUM: Magnesium: 1.7 mg/dL (ref 1.6–2.3)

## 2017-11-25 ENCOUNTER — Encounter: Payer: Self-pay | Admitting: Internal Medicine

## 2017-11-25 ENCOUNTER — Other Ambulatory Visit: Payer: Self-pay

## 2017-11-25 ENCOUNTER — Ambulatory Visit (INDEPENDENT_AMBULATORY_CARE_PROVIDER_SITE_OTHER): Payer: Managed Care, Other (non HMO) | Admitting: Internal Medicine

## 2017-11-25 VITALS — BP 118/79 | HR 105 | Temp 98.1°F | Ht 66.0 in | Wt 155.0 lb

## 2017-11-25 DIAGNOSIS — Z3492 Encounter for supervision of normal pregnancy, unspecified, second trimester: Secondary | ICD-10-CM

## 2017-11-25 DIAGNOSIS — R7611 Nonspecific reaction to tuberculin skin test without active tuberculosis: Secondary | ICD-10-CM

## 2017-11-25 DIAGNOSIS — Z227 Latent tuberculosis: Secondary | ICD-10-CM

## 2017-11-25 NOTE — Progress Notes (Signed)
RFV: referred to out clinic for ltbi  Patient ID: Melanie Meadows, female   DOB: 1995/09/28, 22 y.o.   MRN: [id number]  HPI Melanie Meadows is a 22yo F win her 2nd trimester of her pregnancy. 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 Tested every year. She reports that she had  finished 3 months of rifampin only since she became pregnant. Health dept asked her to come back to them after her pregnancy was completed but she ended up having a miscarriage. She now works at [employer] , not know to have further exposure at ems. Screening for symptoms annually.  Has had repeat cxr - which was negative, but repeat QTF  No travel to endemic country. biometrics, in April 2019 - and had negative screening for symptoms. No cough, nightsweats, or weightloss   Outpatient Encounter Medications as of 11/25/2017  Medication Sig  . albuterol (PROVENTIL HFA;VENTOLIN HFA) 108 (90 Base) MCG/ACT inhaler Inhale 2 puffs into the lungs every 6 (six) hours as needed for wheezing or shortness of breath.  . cephALEXin (KEFLEX) 250 MG capsule Take 1 capsule (250 mg total) by mouth at bedtime. For the rest of pregnancy after you finish your treatment dose  . cephALEXin (KEFLEX) 500 MG capsule Take 1 capsule (500 mg total) by mouth 4 (four) times daily.  Marland Kitchen. ketorolac (TORADOL) 10 MG tablet Take 1 tablet (10 mg total) by mouth every 6 (six) hours as needed.  . tamsulosin (FLOMAX) 0.4 MG CAPS capsule Take 1 capsule (0.4 mg total) by mouth daily.  . ondansetron (ZOFRAN ODT) 4 MG disintegrating tablet Take 1 tablet (4 mg total) by mouth every 8 (eight) hours as needed for nausea or vomiting. (Patient not taking: Reported on 11/25/2017)  . oxyCODONE-acetaminophen (PERCOCET) 5-325 MG tablet Take 1 tablet by mouth every 4 (four) hours as needed for severe pain. (Patient not taking: Reported on 11/25/2017)  . Peak Flow Meter DEVI Measure peak flow daily or if short of  breath.  If <350cc (which is 80% of your expected), take 2 puffs of albuterol rescue inhaler and reassess in 20 mins.  If persistently <350 after 2 rescue attempts, present to hospital for evaluation. (Patient not taking: Reported on 10/21/2017)  . Prenatal MV-Min-FA-Omega-3 (PRENATAL GUMMIES/DHA & FA PO) Take by mouth.   No facility-administered encounter medications on file as of 11/25/2017.      Patient Active Problem List   Diagnosis Date Noted  . UTI (urinary tract infection) during pregnancy, second trimester 11/21/2017  . Asthma, mild intermittent 10/07/2017  . Encounter for supervision of normal pregnancy, unspecified, unspecified trimester 09/10/2017  . 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 Bartholin's cyst 09/10/2017     Health Maintenance Due  Topic Date Due  . TETANUS/TDAP  08/20/2013    Social History   Tobacco Use  . Smoking status: Never Smoker  . Smokeless tobacco: Never Used  Substance Use Topics  . Alcohol use: No  . Drug use: No   family history includes Cancer in her paternal grandmother.  Review of Systems Review of Systems  Constitutional: Negative for fever, chills, diaphoresis, activity change, appetite change, fatigue and unexpected weight change.  HENT: Negative for congestion, sore throat, rhinorrhea, sneezing, trouble swallowing and sinus pressure.  Eyes: Negative for photophobia and visual disturbance.  Respiratory: Negative for cough, chest tightness, shortness of breath, wheezing and stridor.  Cardiovascular: Negative for chest pain, palpitations and leg swelling.  Gastrointestinal:  Negative for nausea, vomiting, abdominal pain, diarrhea, constipation, blood in stool, abdominal distention and anal bleeding.  Genitourinary: Negative for dysuria, hematuria, flank pain and difficulty urinating.  Musculoskeletal: Negative for myalgias, back pain, joint swelling, arthralgias and gait problem.  Skin: Negative for color change, pallor, rash and wound.    Neurological: Negative for dizziness, tremors, weakness and light-headedness.  Hematological: Negative for adenopathy. Does not bruise/bleed easily.  Psychiatric/Behavioral: Negative for behavioral problems, confusion, sleep disturbance, dysphoric mood, decreased concentration and agitation.    Physical Exam   BP 118/79   Pulse (!) 105 Comment: pulse as low as 93 bpm,  but has been 130+ per pt  Temp 98.1 F (36.7 C) (Oral)   Ht 5\' 6"  (1.676 m)   Wt 155 lb (70.3 kg)   LMP 06/20/2017   SpO2 100%   BMI 25.02 kg/m   Physical Exam  Constitutional:  oriented to person, place, and time. appears well-developed and well-nourished. No distress.  HENT: [place]/AT, PERRLA, no scleral icterus Mouth/Throat: Oropharynx is clear and moist. No oropharyngeal exudate.  Cardiovascular: Normal rate, regular rhythm and normal heart sounds. Exam reveals no gallop and no friction rub.  No murmur heard.  Pulmonary/Chest: Effort normal and breath sounds normal. No respiratory distress.  has no wheezes.  Neck = supple, no nuchal rigidity Abdominal: gravid Lymphadenopathy: no cervical adenopathy. No axillary adenopathy Neurological: alert and oriented to person, place, and time.  Skin: Skin is warm and dry. No rash noted. No erythema.  Psychiatric: a normal mood and affect.  behavior is normal.    Lab Results  Component Value Date   LABRPR Non Reactive 09/10/2017    CBC Lab Results  Component Value Date   WBC 11.1 (H) 11/21/2017   RBC 3.59 (L) 11/21/2017   HGB 10.7 (L) 11/21/2017   HCT 32.0 (L) 11/21/2017   PLT 295 11/21/2017   MCV 89 11/21/2017   MCH 29.8 11/21/2017   MCHC 33.4 11/21/2017   RDW 13.0 11/21/2017   LYMPHSABS 3.0 09/10/2017   MONOABS 0.9 03/20/2016   EOSABS 0.2 09/10/2017    BMET Lab Results  Component Value Date   NA 137 11/21/2017   K 4.2 11/21/2017   CL 104 11/21/2017   CO2 21 11/21/2017   GLUCOSE 98 11/21/2017   BUN 6 11/21/2017   CREATININE 0.47 (L) 11/21/2017    CALCIUM 8.7 11/21/2017   GFRNONAA 140 11/21/2017   GFRAA 161 11/21/2017      Assessment and Plan  Hx of ltbi, with partial completion of ltbi treatment in 2017 when she first was diagnosed  - recommend that she should consider redoing ltbi treatment after she has completed her current pregnancy and has finished breast feeding - will send her appt in 1 year  Second trimester pregnancy = recommend to continue with taking prenatal vitamins

## 2017-11-28 ENCOUNTER — Ambulatory Visit (INDEPENDENT_AMBULATORY_CARE_PROVIDER_SITE_OTHER): Payer: Managed Care, Other (non HMO) | Admitting: Obstetrics & Gynecology

## 2017-11-28 VITALS — BP 112/78 | HR 105 | Wt 157.2 lb

## 2017-11-28 DIAGNOSIS — O99012 Anemia complicating pregnancy, second trimester: Secondary | ICD-10-CM

## 2017-11-28 DIAGNOSIS — O2342 Unspecified infection of urinary tract in pregnancy, second trimester: Secondary | ICD-10-CM

## 2017-11-28 DIAGNOSIS — Z3480 Encounter for supervision of other normal pregnancy, unspecified trimester: Secondary | ICD-10-CM

## 2017-11-28 DIAGNOSIS — O99019 Anemia complicating pregnancy, unspecified trimester: Secondary | ICD-10-CM | POA: Insufficient documentation

## 2017-11-28 MED ORDER — FERROUS SULFATE 325 (65 FE) MG PO TABS: 325.0000 mg | ORAL_TABLET | Freq: Two times a day (BID) | ORAL | 3 refills | Status: DC

## 2017-11-28 MED ORDER — NITROFURANTOIN MONOHYD MACRO 100 MG PO CAPS: 100.0000 mg | ORAL_CAPSULE | Freq: Every day | ORAL | 4 refills | Status: DC

## 2017-11-28 NOTE — Progress Notes (Signed)
   PRENATAL VISIT NOTE  Subjective:  Melanie Meadows is a 22 y.o. G2P0010 at 2529w0d being seen today for ongoing prenatal care.  She is currently monitored for the following issues for this low-risk pregnancy and has Encounter for supervision of normal pregnancy, unspecified, unspecified trimester; TB lung, latent; Bartholin's cyst; Asthma, mild intermittent; UTI (urinary tract infection) during pregnancy, second trimester; and Anemia in pregnancy on their problem list.  Patient reports She still has occasions of tachycardia, has cards appt in 9/19.Marland Kitchen.  Contractions: Not present. Vag. Bleeding: None.  Movement: Present. Denies leaking of fluid.   The following portions of the patient's history were reviewed and updated as appropriate: allergies, current medications, past family history, past medical history, past social history, past surgical history and problem list. Problem list updated.  Objective:   Vitals:   11/28/17 1049  BP: 112/78  Pulse: (!) 105  Weight: 157 lb 3.2 oz (71.3 kg)    Fetal Status: Fetal Heart Rate (bpm): 140   Movement: Present     General:  Alert, oriented and cooperative. Patient is in no acute distress.  Skin: Skin is warm and dry. No rash noted.   Cardiovascular: Normal heart rate noted  Respiratory: Normal respiratory effort, no problems with respiration noted  Abdomen: Soft, gravid, appropriate for gestational age.  Pain/Pressure: Absent     Pelvic: Cervical exam deferred        Extremities: Normal range of motion.  Edema: Trace  Mental Status: Normal mood and affect. Normal behavior. Normal judgment and thought content.   Assessment and Plan:  Pregnancy: G2P0010 at 5529w0d  1. UTI (urinary tract infection) during pregnancy, second trimester - per Dr. Vergie LivingPickens, start suppression daily  - US RENAL; Future  2. Supervision of other normal pregnancy, antepartum   3. Anemia during pregnancy in second trimester - ferrous sulfate BID - discussed ways to  avoid constipation  Preterm labor symptoms and general obstetric precautions including but not limited to vaginal bleeding, contractions, leaking of fluid and fetal movement were reviewed in detail with the patient. Please refer to After Visit Summary for other counseling recommendations.  Return in about 1 month (around 12/26/2017) for 2 hour GTT.  Future Appointments  Date Time Provider Department Center  01/19/2018 11:20 AM Gollan, Tollie Pizzaimothy J, MD [facility]    Melanie BossierMyra C Danielle Mink, MD

## 2017-12-02 ENCOUNTER — Telehealth: Payer: Self-pay | Admitting: *Deleted

## 2017-12-02 NOTE — Telephone Encounter (Signed)
Was able to get patient scheduled [facility] on [address] Her appointment is 8/7 11:00 (10:45 arrival) with Dr Allyson SabalBerry.  RN notified patient, she accepted.  RN noted that patient's GTT appointment was cancelled in error during reschedule process - patient will contact Dr. Tawni LevyPratt's office to reschedule. Andree CossHowell, Forrest Demuro M, RN

## 2017-12-10 ENCOUNTER — Ambulatory Visit (INDEPENDENT_AMBULATORY_CARE_PROVIDER_SITE_OTHER): Payer: Managed Care, Other (non HMO) | Admitting: Cardiovascular Disease

## 2017-12-10 ENCOUNTER — Encounter: Payer: Self-pay | Admitting: Cardiovascular Disease

## 2017-12-10 VITALS — BP 124/76 | HR 103 | Ht 66.0 in | Wt 161.4 lb

## 2017-12-10 DIAGNOSIS — R002 Palpitations: Secondary | ICD-10-CM | POA: Diagnosis not present

## 2017-12-10 NOTE — Progress Notes (Signed)
12/10/2017 Melanie Meadows   04-18-1996  [id number]  Primary Physician Practice, [facility] Primary Cardiologist: Runell Gess MD [address], [place], [place]  HPI:  Melanie Meadows is a 22 y.o. engaged Caucasian female currently [redacted] weeks pregnant referred to me by Dr. Vergie Living, OB/GYN, for cardiovascular evaluation because of palpitations.  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  She is on no new medications.  She has no cardiac risk factors.  She does not drink alcohol or caffeine.  This is her first pregnancy.  She is had palpitations since age 30 which have become more frequent and severe since her pregnancy.  The last 30 to 40 minutes of time and are associated with some shortness of breath and dizziness.   Current Meds  Medication Sig  . albuterol (PROVENTIL HFA;VENTOLIN HFA) 108 (90 Base) MCG/ACT inhaler Inhale 2 puffs into the lungs every 6 (six) hours as needed for wheezing or shortness of breath.  . ferrous sulfate 325 (65 FE) MG tablet Take 1 tablet (325 mg total) by mouth 2 (two) times daily with a meal.  . Prenatal MV-Min-FA-Omega-3 (PRENATAL GUMMIES/DHA & FA PO) Take by mouth.     Allergies  Allergen Reactions  . Pork-Derived Products     Social History   Socioeconomic History  . Marital status: Single    Spouse name: Not on file  . Number of children: Not on file  . Years of education: Not on file  . Highest education level: Not on file  Occupational History  . Not on file  Social Needs  . Financial resource strain: Not on file  . Food insecurity:    Worry: Not on file    Inability: Not on file  . Transportation needs:    Medical: Not on file    Non-medical: Not on file  Tobacco Use  . Smoking status: Never Smoker  . Smokeless tobacco: Never Used  Substance and Sexual Activity  . Alcohol use: No  . Drug use: No  . Sexual activity: Yes    Partners: Male    Birth control/protection: None  Lifestyle  . Physical activity:     Days per week: Not on file    Minutes per session: Not on file  . Stress: Not on file  Relationships  . Social connections:    Talks on phone: Not on file    Gets together: Not on file    Attends religious service: Not on file    Active member of club or organization: Not on file    Attends meetings of clubs or organizations: Not on file    Relationship status: Not on file  . Intimate partner violence:    Fear of current or ex partner: Not on file    Emotionally abused: Not on file    Physically abused: Not on file    Forced sexual activity: Not on file  Other Topics Concern  . Not on file  Social History Narrative  . Not on file     Review of Systems: General: negative for chills, fever, night sweats or weight changes.  Cardiovascular: negative for chest pain, dyspnea on exertion, edema, orthopnea, palpitations, paroxysmal nocturnal dyspnea or shortness of breath Dermatological: negative for rash Respiratory: negative for cough or wheezing Urologic: negative for hematuria Abdominal: negative for nausea, vomiting, diarrhea, bright red blood per rectum, melena, or hematemesis Neurologic: negative for visual changes, syncope, or dizziness All other systems reviewed and are otherwise negative  except as noted above.    Blood pressure 124/76, pulse (!) 103, height 5\' 6"  (1.676 m), weight 161 lb 6.4 oz (73.2 kg), last menstrual period 06/20/2017, SpO2 99 %.  General appearance: alert and no distress Neck: no adenopathy, no carotid bruit, no JVD, supple, symmetrical, trachea midline and thyroid not enlarged, symmetric, no tenderness/mass/nodules Lungs: clear to auscultation bilaterally Heart: Soft flow murmur Extremities: extremities normal, atraumatic, no cyanosis or edema Pulses: 2+ and symmetric Skin: Skin color, texture, turgor normal. No rashes or lesions Neurologic: Alert and oriented X 3, normal strength and tone. Normal symmetric reflexes. Normal coordination and  gait  EKG sinus tachycardia at 103 with nonspecific ST and T wave changes.  I personally reviewed this EKG.  ASSESSMENT AND PLAN:   Palpitations Melanie Meadows is referred to me by Dr. Vergie LivingPickens, OB/GYN, for evaluation of palpitations.  She is had these since she was 22 years old but they have become more frequent and severe since her pregnancy.  She is currently [redacted] weeks pregnant.  The episodes last 30 to 40 minutes at a time and are associated with shortness of breath and occasional dizziness.  They can occur several days a time and then go weeks without recurrence.  Her TSH was normal.  She does not drink caffeine or alcohol.  I am going to get a 2D echocardiogram and a 30-day event monitor to further evaluate.      Runell GessJonathan J. Mousa Prout MD FACP,FACC,FAHA, [facility] 12/10/2017 11:35 AM

## 2017-12-10 NOTE — Patient Instructions (Signed)
Medication Instructions:   NO CHANGE  Testing/Procedures:  Your physician has requested that you have an echocardiogram. Echocardiography is a painless test that uses sound waves to create images of your heart. It provides your doctor with information about the size and shape of your heart and how well your heart's chambers and valves are working. This procedure takes approximately one hour. There are no restrictions for this procedure.   Your physician has recommended that you wear a 30 DAY event monitor. Event monitors are medical devices that record the heart's electrical activity. Doctors most often us these monitors to diagnose arrhythmias. Arrhythmias are problems with the speed or rhythm of the heartbeat. The monitor is a small, portable device. You can wear one while you do your normal daily activities. This is usually used to diagnose what is causing palpitations/syncope (passing out).    Follow-Up:  Your physician recommends that you schedule a follow-up appointment in: 6 WEEKS WITH DR Allyson SabalBERRY

## 2017-12-10 NOTE — Assessment & Plan Note (Signed)
Melanie Meadows is referred to me by Dr. Vergie LivingPickens, OB/GYN, for evaluation of palpitations.  She is had these since she was 22 years old but they have become more frequent and severe since her pregnancy.  She is currently [redacted] weeks pregnant.  The episodes last 30 to 40 minutes at a time and are associated with shortness of breath and occasional dizziness.  They can occur several days a time and then go weeks without recurrence.  Her TSH was normal.  She does not drink caffeine or alcohol.  I am going to get a 2D echocardiogram and a 30-day event monitor to further evaluate.

## 2017-12-18 ENCOUNTER — Other Ambulatory Visit: Payer: Self-pay

## 2017-12-18 ENCOUNTER — Ambulatory Visit (INDEPENDENT_AMBULATORY_CARE_PROVIDER_SITE_OTHER): Payer: Managed Care, Other (non HMO)

## 2017-12-18 ENCOUNTER — Ambulatory Visit (HOSPITAL_COMMUNITY): Payer: Managed Care, Other (non HMO) | Attending: Cardiology

## 2017-12-18 DIAGNOSIS — R002 Palpitations: Secondary | ICD-10-CM

## 2017-12-18 DIAGNOSIS — I371 Nonrheumatic pulmonary valve insufficiency: Secondary | ICD-10-CM | POA: Insufficient documentation

## 2017-12-24 ENCOUNTER — Encounter: Payer: Managed Care, Other (non HMO) | Admitting: Family Medicine

## 2017-12-24 ENCOUNTER — Ambulatory Visit (INDEPENDENT_AMBULATORY_CARE_PROVIDER_SITE_OTHER): Payer: Managed Care, Other (non HMO) | Admitting: Family Medicine

## 2017-12-24 VITALS — BP 107/72 | HR 79 | Wt 166.8 lb

## 2017-12-24 DIAGNOSIS — Z23 Encounter for immunization: Secondary | ICD-10-CM

## 2017-12-24 DIAGNOSIS — Z3480 Encounter for supervision of other normal pregnancy, unspecified trimester: Secondary | ICD-10-CM

## 2017-12-24 DIAGNOSIS — Z3402 Encounter for supervision of normal first pregnancy, second trimester: Secondary | ICD-10-CM

## 2017-12-24 MED ORDER — TETANUS-DIPHTH-ACELL PERTUSSIS 5-2.5-18.5 LF-MCG/0.5 IM SUSP: 0.5000 mL | Freq: Once | INTRAMUSCULAR | Status: DC

## 2017-12-24 NOTE — Patient Instructions (Addendum)
Tdap Vaccine (Tetanus, Diphtheria and Pertussis): What You Need to Know  1. Why get vaccinated? Tetanus, diphtheria and pertussis are very serious diseases. Tdap vaccine can protect [place] from these diseases. And, Tdap vaccine given to pregnant women can protect newborn babies against pertussis. TETANUS (Lockjaw) is rare in the [place] today. It causes painful muscle tightening and stiffness, usually all over the body.  It can lead to tightening of muscles in the head and neck so you can't open your mouth, swallow, or sometimes even breathe. Tetanus kills about 1 out of 10 people who are infected even after receiving the best medical care.  DIPHTHERIA is also rare in the [place] today. It can cause a thick coating to form in the back of the throat.  It can lead to breathing problems, heart failure, paralysis, and death.  PERTUSSIS (Whooping Cough) causes severe coughing spells, which can cause difficulty breathing, vomiting and disturbed sleep.  It can also lead to weight loss, incontinence, and rib fractures. Up to 2 in 100 adolescents and 5 in 100 adults with pertussis are hospitalized or have complications, which could include pneumonia or death.  These diseases are caused by bacteria. Diphtheria and pertussis are spread from person to person through secretions from coughing or sneezing. Tetanus enters the body through cuts, scratches, or wounds. Before vaccines, as many as 200,000 cases of diphtheria, 200,000 cases of pertussis, and hundreds of cases of tetanus, were reported in the [place] each year. Since vaccination began, reports of cases for tetanus and diphtheria have dropped by about 99% and for pertussis by about 80%. 2. Tdap vaccine Tdap vaccine can protect adolescents and adults from tetanus, diphtheria, and pertussis. One dose of Tdap is routinely given at age 14 or 59. People who did not get Tdap at that age should get it as soon as possible. Tdap is especially  important for healthcare professionals and anyone having close contact with a baby younger than 12 months. Pregnant women should get a dose of Tdap during every pregnancy, to protect the newborn from pertussis. Infants are most at risk for severe, life-threatening complications from pertussis. Another vaccine, called Td, protects against tetanus and diphtheria, but not pertussis. A Td booster should be given every 10 years. Tdap may be given as one of these boosters if you have never gotten Tdap before. Tdap may also be given after a severe cut or burn to prevent tetanus infection. Your doctor or the person giving you the vaccine can give you more information. Tdap may safely be given at the same time as other vaccines. 3. Some people should not get this vaccine  A person who has ever had a life-threatening allergic reaction after a previous dose of any diphtheria, tetanus or pertussis containing vaccine, OR has a severe allergy to any part of this vaccine, should not get Tdap vaccine. Tell the person giving the vaccine about any severe allergies.  Anyone who had coma or long repeated seizures within 7 days after a childhood dose of DTP or DTaP, or a previous dose of Tdap, should not get Tdap, unless a cause other than the vaccine was found. They can still get Td.  Talk to your doctor if you: ? have seizures or another nervous system problem, ? had severe pain or swelling after any vaccine containing diphtheria, tetanus or pertussis, ? ever had a condition called Guillain-Barr Syndrome (GBS), ? aren't feeling well on the day the shot is scheduled. 4. Risks With any medicine,  including vaccines, there is a chance of side effects. These are usually mild and go away on their own. Serious reactions are also possible but are rare. Most people who get Tdap vaccine do not have any problems with it. Mild problems following Tdap: (Did not interfere with activities)  Pain where the shot was given (about  3 in 4 adolescents or 2 in 3 adults)  Redness or swelling where the shot was given (about 1 person in 5)  Mild fever of at least 100.4F (up to about 1 in 25 adolescents or 1 in 100 adults)  Headache (about 3 or 4 people in 10)  Tiredness (about 1 person in 3 or 4)  Nausea, vomiting, diarrhea, stomach ache (up to 1 in 4 adolescents or 1 in 10 adults)  Chills, sore joints (about 1 person in 10)  Body aches (about 1 person in 3 or 4)  Rash, swollen glands (uncommon)  Moderate problems following Tdap: (Interfered with activities, but did not require medical attention)  Pain where the shot was given (up to 1 in 5 or 6)  Redness or swelling where the shot was given (up to about 1 in 16 adolescents or 1 in 12 adults)  Fever over 102F (about 1 in 100 adolescents or 1 in 250 adults)  Headache (about 1 in 7 adolescents or 1 in 10 adults)  Nausea, vomiting, diarrhea, stomach ache (up to 1 or 3 people in 100)  Swelling of the entire arm where the shot was given (up to about 1 in 500).  Severe problems following Tdap: (Unable to perform usual activities; required medical attention)  Swelling, severe pain, bleeding and redness in the arm where the shot was given (rare).  Problems that could happen after any vaccine:  People sometimes faint after a medical procedure, including vaccination. Sitting or lying down for about 15 minutes can help prevent fainting, and injuries caused by a fall. Tell your doctor if you feel dizzy, or have vision changes or ringing in the ears.  Some people get severe pain in the shoulder and have difficulty moving the arm where a shot was given. This happens very rarely.  Any medication can cause a severe allergic reaction. Such reactions from a vaccine are very rare, estimated at fewer than 1 in a million doses, and would happen within a few minutes to a few hours after the vaccination. As with any medicine, there is a very remote chance of a vaccine  causing a serious injury or death. The safety of vaccines is always being monitored. For more information, visit: www.cdc.gov/vaccinesafety/ 5. What if there is a serious problem? What should I look for? Look for anything that concerns you, such as signs of a severe allergic reaction, very high fever, or unusual behavior. Signs of a severe allergic reaction can include hives, swelling of the face and throat, difficulty breathing, a fast heartbeat, dizziness, and weakness. These would usually start a few minutes to a few hours after the vaccination. What should I do?  If you think it is a severe allergic reaction or other emergency that can't wait, call 9-1-1 or get the person to the nearest hospital. Otherwise, call your doctor.  Afterward, the reaction should be reported to the Vaccine Adverse Event Reporting System (VAERS). Your doctor might file this report, or you can do it yourself through the VAERS web site at www.vaers.hhs.gov, or by calling 1-800-822-7967. ? VAERS does not give medical advice. 6. The National Vaccine Injury Compensation Program The   National Sport and exercise psychologist (VICP) is a Technical brewer that was created to compensate people who may have been injured by certain vaccines. Persons who believe they may have been injured by a vaccine can learn about the program and about filing a claim by calling (630)368-9168 or visiting the [place] website at GoldCloset.com.ee. There is a time limit to file a claim for compensation. 7. How can I learn more?  Ask your doctor. He or she can give you the vaccine package insert or suggest other sources of information.  Call your local or state health department.  Contact the Centers for Disease Control and Prevention (CDC): ? Call (719)697-6385 (1-800-CDC-INFO) or ? Visit CDC's website at http://hunter.com/ CDC Tdap Vaccine VIS (06/29/13) This information is not intended to replace advice given to you by your  health care provider. Make sure you discuss any questions you have with your health care provider. Document Released: 10/22/2011 Document Revised: 01/11/2016 Document Reviewed: 01/11/2016 Elsevier Interactive Patient Education  2017 [place].  Influenza (Flu) Vaccine (Inactivated or Recombinant): What You Need to Know  1. Why get vaccinated? Influenza ("flu") is a contagious disease that spreads around the [place] every year, usually between October and May. Flu is caused by influenza viruses, and is spread mainly by coughing, sneezing, and close contact. Anyone can get flu. Flu strikes suddenly and can last several days. Symptoms vary by age, but can include:  fever/chills  sore throat  muscle aches  fatigue  cough  headache  runny or stuffy nose  Flu can also lead to pneumonia and blood infections, and cause diarrhea and seizures in children. If you have a medical condition, such as heart or lung disease, flu can make it worse. Flu is more dangerous for some people. Infants and young children, people 56 years of age and older, pregnant women, and people with certain health conditions or a weakened immune system are at greatest risk. Each year thousands of people in the [place] die from flu, and many more are hospitalized. Flu vaccine can:  keep you from getting flu,  make flu less severe if you do get it, and  keep you from spreading flu to your family and other people. 2. Inactivated and recombinant flu vaccines A dose of flu vaccine is recommended every flu season. Children 6 months through 18 years of age may need two doses during the same flu season. Everyone else needs only one dose each flu season. Some inactivated flu vaccines contain a very small amount of a mercury-based preservative called thimerosal. Studies have not shown thimerosal in vaccines to be harmful, but flu vaccines that do not contain thimerosal are available. There is no live flu virus  in flu shots. They cannot cause the flu. There are many flu viruses, and they are always changing. Each year a new flu vaccine is made to protect against three or four viruses that are likely to cause disease in the upcoming flu season. But even when the vaccine doesn't exactly match these viruses, it may still provide some protection. Flu vaccine cannot prevent:  flu that is caused by a virus not covered by the vaccine, or  illnesses that look like flu but are not.  It takes about 2 weeks for protection to develop after vaccination, and protection lasts through the flu season. 3. Some people should not get this vaccine Tell the person who is giving you the vaccine:  If you have any severe, life-threatening allergies. If you ever had  a life-threatening allergic reaction after a dose of flu vaccine, or have a severe allergy to any part of this vaccine, you may be advised not to get vaccinated. Most, but not all, types of flu vaccine contain a small amount of egg protein.  If you ever had Guillain-Barr Syndrome (also called GBS). Some people with a history of GBS should not get this vaccine. This should be discussed with your doctor.  If you are not feeling well. It is usually okay to get flu vaccine when you have a mild illness, but you might be asked to come back when you feel better.  4. Risks of a vaccine reaction With any medicine, including vaccines, there is a chance of reactions. These are usually mild and go away on their own, but serious reactions are also possible. Most people who get a flu shot do not have any problems with it. Minor problems following a flu shot include:  soreness, redness, or swelling where the shot was given  hoarseness  sore, red or itchy eyes  cough  fever  aches  headache  itching  fatigue  If these problems occur, they usually begin soon after the shot and last 1 or 2 days. More serious problems following a flu shot can include the  following:  There may be a small increased risk of Guillain-Barre Syndrome (GBS) after inactivated flu vaccine. This risk has been estimated at 1 or 2 additional cases per million people vaccinated. This is much lower than the risk of severe complications from flu, which can be prevented by flu vaccine.  Young children who get the flu shot along with pneumococcal vaccine (PCV13) and/or DTaP vaccine at the same time might be slightly more likely to have a seizure caused by fever. Ask your doctor for more information. Tell your doctor if a child who is getting flu vaccine has ever had a seizure.  Problems that could happen after any injected vaccine:  People sometimes faint after a medical procedure, including vaccination. Sitting or lying down for about 15 minutes can help prevent fainting, and injuries caused by a fall. Tell your doctor if you feel dizzy, or have vision changes or ringing in the ears.  Some people get severe pain in the shoulder and have difficulty moving the arm where a shot was given. This happens very rarely.  Any medication can cause a severe allergic reaction. Such reactions from a vaccine are very rare, estimated at about 1 in a million doses, and would happen within a few minutes to a few hours after the vaccination. As with any medicine, there is a very remote chance of a vaccine causing a serious injury or death. The safety of vaccines is always being monitored. For more information, visit: http://www.aguilar.org/ 5. What if there is a serious reaction? What should I look for? Look for anything that concerns you, such as signs of a severe allergic reaction, very high fever, or unusual behavior. Signs of a severe allergic reaction can include hives, swelling of the face and throat, difficulty breathing, a fast heartbeat, dizziness, and weakness. These would start a few minutes to a few hours after the vaccination. What should I do?  If you think it is a severe  allergic reaction or other emergency that can't wait, call 9-1-1 and get the person to the nearest hospital. Otherwise, call your doctor.  Reactions should be reported to the Vaccine Adverse Event Reporting System (VAERS). Your doctor should file this report, or you can do  it yourself through the VAERS web site at www.vaers.LAgents.nohhs.gov, or by calling 1-720-281-6878. ? VAERS does not give medical advice. 6. The National Vaccine Injury Compensation Program The [employer] Vaccine Injury Compensation Program (VICP) is a federal program that was created to compensate people who may have been injured by certain vaccines. Persons who believe they may have been injured by a vaccine can learn about the program and about filing a claim by calling 1-9164393827 or visiting the VICP website at SpiritualWord.atwww.hrsa.gov/vaccinecompensation. There is a time limit to file a claim for compensation. 7. How can I learn more?  Ask your healthcare provider. He or she can give you the vaccine package insert or suggest other sources of information.  Call your local or state health department.  Contact the Centers for Disease Control and Prevention (CDC): ? Call (972)751-70591-514-095-8384 (1-800-CDC-INFO) or ? Visit CDC's website at BiotechRoom.com.cywww.cdc.gov/flu Vaccine Information Statement, Inactivated Influenza Vaccine (12/10/2013) This information is not intended to replace advice given to you by your health care provider. Make sure you discuss any questions you have with your health care provider. Document Released: 02/14/2006 Document Revised: 01/11/2016 Document Reviewed: 01/11/2016 Elsevier Interactive Patient Education  2017 [employer] Inc.   Breastfeeding Choosing to breastfeed is one of the best decisions you can make for yourself and your baby. A change in hormones during pregnancy causes your breasts to make breast milk in your milk-producing glands. Hormones prevent breast milk from being released before your baby is born. They also prompt milk  flow after birth. Once breastfeeding has begun, thoughts of your baby, as well as his or her sucking or crying, can stimulate the release of milk from your milk-producing glands. Benefits of breastfeeding Research shows that breastfeeding offers many health benefits for infants and mothers. It also offers a cost-free and convenient way to feed your baby. For your baby  Your first milk (colostrum) helps your baby's digestive system to function better.  Special cells in your milk (antibodies) help your baby to fight off infections.  Breastfed babies are less likely to develop asthma, allergies, obesity, or type 2 diabetes. They are also at lower risk for sudden infant death syndrome (SIDS).  Nutrients in breast milk are better able to meet your baby's needs compared to infant formula.  Breast milk improves your baby's brain development. For you  Breastfeeding helps to create a very special bond between you and your baby.  Breastfeeding is convenient. Breast milk costs nothing and is always available at the correct temperature.  Breastfeeding helps to burn calories. It helps you to lose the weight that you gained during pregnancy.  Breastfeeding makes your uterus return faster to its size before pregnancy. It also slows bleeding (lochia) after you give birth.  Breastfeeding helps to lower your risk of developing type 2 diabetes, osteoporosis, rheumatoid arthritis, cardiovascular disease, and breast, ovarian, uterine, and endometrial cancer later in life. Breastfeeding basics Starting breastfeeding  Find a comfortable place to sit or lie down, with your neck and back well-supported.  Place a pillow or a rolled-up blanket under your baby to bring him or her to the level of your breast (if you are seated). Nursing pillows are specially designed to help support your arms and your baby while you breastfeed.  Make sure that your baby's tummy (abdomen) is facing your abdomen.  Gently massage  your breast. With your fingertips, massage from the outer edges of your breast inward toward the nipple. This encourages milk flow. If your milk flows slowly, you may  need to continue this action during the feeding.  Support your breast with 4 fingers underneath and your thumb above your nipple (make the letter "C" with your hand). Make sure your fingers are well away from your nipple and your baby's mouth.  Stroke your baby's lips gently with your finger or nipple.  When your baby's mouth is open wide enough, quickly bring your baby to your breast, placing your entire nipple and as much of the areola as possible into your baby's mouth. The areola is the colored area around your nipple. ? More areola should be visible above your baby's upper lip than below the lower lip. ? Your baby's lips should be opened and extended outward (flanged) to ensure an adequate, comfortable latch. ? Your baby's tongue should be between his or her lower gum and your breast.  Make sure that your baby's mouth is correctly positioned around your nipple (latched). Your baby's lips should create a seal on your breast and be turned out (everted).  It is common for your baby to suck about 2-3 minutes in order to start the flow of breast milk. Latching Teaching your baby how to latch onto your breast properly is very important. An improper latch can cause nipple pain, decreased milk supply, and poor weight gain in your baby. Also, if your baby is not latched onto your nipple properly, he or she may swallow some air during feeding. This can make your baby fussy. Burping your baby when you switch breasts during the feeding can help to get rid of the air. However, teaching your baby to latch on properly is still the best way to prevent fussiness from swallowing air while breastfeeding. Signs that your baby has successfully latched onto your nipple  Silent tugging or silent sucking, without causing you pain. Infant's lips should  be extended outward (flanged).  Swallowing heard between every 3-4 sucks once your milk has started to flow (after your let-down milk reflex occurs).  Muscle movement above and in front of his or her ears while sucking.  Signs that your baby has not successfully latched onto your nipple  Sucking sounds or smacking sounds from your baby while breastfeeding.  Nipple pain.  If you think your baby has not latched on correctly, slip your finger into the corner of your baby's mouth to break the suction and place it between your baby's gums. Attempt to start breastfeeding again. Signs of successful breastfeeding Signs from your baby  Your baby will gradually decrease the number of sucks or will completely stop sucking.  Your baby will fall asleep.  Your baby's body will relax.  Your baby will retain a small amount of milk in his or her mouth.  Your baby will let go of your breast by himself or herself.  Signs from you  Breasts that have increased in firmness, weight, and size 1-3 hours after feeding.  Breasts that are softer immediately after breastfeeding.  Increased milk volume, as well as a change in milk consistency and color by the fifth day of breastfeeding.  Nipples that are not sore, cracked, or bleeding.  Signs that your baby is getting enough milk  Wetting at least 1-2 diapers during the first 24 hours after birth.  Wetting at least 5-6 diapers every 24 hours for the first week after birth. The urine should be clear or pale yellow by the age of 5 days.  Wetting 6-8 diapers every 24 hours as your baby continues to grow and develop.  At  least 3 stools in a 24-hour period by the age of 5 days. The stool should be soft and yellow.  At least 3 stools in a 24-hour period by the age of 7 days. The stool should be seedy and yellow.  No loss of weight greater than 10% of birth weight during the first 3 days of life.  Average weight gain of 4-7 oz (113-198 g) per week after  the age of 4 days.  Consistent daily weight gain by the age of 5 days, without weight loss after the age of 2 weeks. After a feeding, your baby may spit up a small amount of milk. This is normal. Breastfeeding frequency and duration Frequent feeding will help you make more milk and can prevent sore nipples and extremely full breasts (breast engorgement). Breastfeed when you feel the need to reduce the fullness of your breasts or when your baby shows signs of hunger. This is called "breastfeeding on demand." Signs that your baby is hungry include:  Increased alertness, activity, or restlessness.  Movement of the head from side to side.  Opening of the mouth when the corner of the mouth or cheek is stroked (rooting).  Increased sucking sounds, smacking lips, cooing, sighing, or squeaking.  Hand-to-mouth movements and sucking on fingers or hands.  Fussing or crying.  Avoid introducing a pacifier to your baby in the first 4-6 weeks after your baby is born. After this time, you may choose to use a pacifier. Research has shown that pacifier use during the first year of a baby's life decreases the risk of sudden infant death syndrome (SIDS). Allow your baby to feed on each breast as long as he or she wants. When your baby unlatches or falls asleep while feeding from the first breast, offer the second breast. Because newborns are often sleepy in the first few weeks of life, you may need to awaken your baby to get him or her to feed. Breastfeeding times will vary from baby to baby. However, the following rules can serve as a guide to help you make sure that your baby is properly fed:  Newborns (babies 644 weeks of age or younger) may breastfeed every 1-3 hours.  Newborns should not go without breastfeeding for longer than 3 hours during the day or 5 hours during the night.  You should breastfeed your baby a minimum of 8 times in a 24-hour period.  Breast milk pumping Pumping and storing breast  milk allows you to make sure that your baby is exclusively fed your breast milk, even at times when you are unable to breastfeed. This is especially important if you go back to work while you are still breastfeeding, or if you are not able to be present during feedings. Your lactation consultant can help you find a method of pumping that works best for you and give you guidelines about how long it is safe to store breast milk. Caring for your breasts while you breastfeed Nipples can become dry, cracked, and sore while breastfeeding. The following recommendations can help keep your breasts moisturized and healthy:  Avoid using soap on your nipples.  Wear a supportive bra designed especially for nursing. Avoid wearing underwire-style bras or extremely tight bras (sports bras).  Air-dry your nipples for 3-4 minutes after each feeding.  Use only cotton bra pads to absorb leaked breast milk. Leaking of breast milk between feedings is normal.  Use lanolin on your nipples after breastfeeding. Lanolin helps to maintain your skin's normal moisture barrier.  Pure lanolin is not harmful (not toxic) to your baby. You may also hand express a few drops of breast milk and gently massage that milk into your nipples and allow the milk to air-dry.  In the first few weeks after giving birth, some women experience breast engorgement. Engorgement can make your breasts feel heavy, warm, and tender to the touch. Engorgement peaks within 3-5 days after you give birth. The following recommendations can help to ease engorgement:  Completely empty your breasts while breastfeeding or pumping. You may want to start by applying warm, moist heat (in the shower or with warm, water-soaked hand towels) just before feeding or pumping. This increases circulation and helps the milk flow. If your baby does not completely empty your breasts while breastfeeding, pump any extra milk after he or she is finished.  Apply ice packs to your  breasts immediately after breastfeeding or pumping, unless this is too uncomfortable for you. To do this: ? Put ice in a plastic bag. ? Place a towel between your skin and the bag. ? Leave the ice on for 20 minutes, 2-3 times a day.  Make sure that your baby is latched on and positioned properly while breastfeeding.  If engorgement persists after 48 hours of following these recommendations, contact your health care provider or a Advertising copywriter. Overall health care recommendations while breastfeeding  Eat 3 healthy meals and 3 snacks every day. Well-nourished mothers who are breastfeeding need an additional 450-500 calories a day. You can meet this requirement by increasing the amount of a balanced diet that you eat.  Drink enough water to keep your urine pale yellow or clear.  Rest often, relax, and continue to take your prenatal vitamins to prevent fatigue, stress, and low vitamin and mineral levels in your body (nutrient deficiencies).  Do not use any products that contain nicotine or tobacco, such as cigarettes and e-cigarettes. Your baby may be harmed by chemicals from cigarettes that pass into breast milk and exposure to secondhand smoke. If you need help quitting, ask your health care provider.  Avoid alcohol.  Do not use illegal drugs or marijuana.  Talk with your health care provider before taking any medicines. These include over-the-counter and prescription medicines as well as vitamins and herbal supplements. Some medicines that may be harmful to your baby can pass through breast milk.  It is possible to become pregnant while breastfeeding. If birth control is desired, ask your health care provider about options that will be safe while breastfeeding your baby. Where to find more information: [employer]: www.llli.org Contact a health care provider if:  You feel like you want to stop breastfeeding or have become frustrated with breastfeeding.  Your  nipples are cracked or bleeding.  Your breasts are red, tender, or warm.  You have: ? Painful breasts or nipples. ? A swollen area on either breast. ? A fever or chills. ? Nausea or vomiting. ? Drainage other than breast milk from your nipples.  Your breasts do not become full before feedings by the fifth day after you give birth.  You feel sad and depressed.  Your baby is: ? Too sleepy to eat well. ? Having trouble sleeping. ? More than 74 week old and wetting fewer than 6 diapers in a 24-hour period. ? Not gaining weight by 38 days of age.  Your baby has fewer than 3 stools in a 24-hour period.  Your baby's skin or the white parts of his or her eyes become  yellow. Get help right away if:  Your baby is overly tired (lethargic) and does not want to wake up and feed.  Your baby develops an unexplained fever. Summary  Breastfeeding offers many health benefits for infant and mothers.  Try to breastfeed your infant when he or she shows early signs of hunger.  Gently tickle or stroke your baby's lips with your finger or nipple to allow the baby to open his or her mouth. Bring the baby to your breast. Make sure that much of the areola is in your baby's mouth. Offer one side and burp the baby before you offer the other side.  Talk with your health care provider or lactation consultant if you have questions or you face problems as you breastfeed. This information is not intended to replace advice given to you by your health care provider. Make sure you discuss any questions you have with your health care provider. Document Released: 04/22/2005 Document Revised: 05/24/2016 Document Reviewed: 05/24/2016 Elsevier Interactive Patient Education  [employer].

## 2017-12-24 NOTE — Progress Notes (Signed)
   PRENATAL VISIT NOTE  Subjective:  Melanie Meadows is a 22 y.o. G2P0010 at 9076w5d being seen today for ongoing prenatal care.  She is currently monitored for the following issues for this low-risk pregnancy and has Encounter for supervision of normal pregnancy, unspecified, unspecified trimester; TB lung, latent; Bartholin's cyst; Asthma, mild intermittent; UTI (urinary tract infection) during pregnancy, second trimester; Anemia in pregnancy; and Palpitations on their problem list.  Patient reports LUQ pain.  Contractions: Not present. Vag. Bleeding: None.  Movement: Present. Denies leaking of fluid.   The following portions of the patient's history were reviewed and updated as appropriate: allergies, current medications, past family history, past medical history, past social history, past surgical history and problem list. Problem list updated.  Objective:   Vitals:   12/24/17 0905  BP: 107/72  Pulse: 79  Weight: 166 lb 12.8 oz (75.7 kg)    Fetal Status: Fetal Heart Rate (bpm): 140 Fundal Height: 27 cm Movement: Present     General:  Alert, oriented and cooperative. Patient is in no acute distress.  Skin: Skin is warm and dry. No rash noted.   Cardiovascular: Normal heart rate noted  Respiratory: Normal respiratory effort, no problems with respiration noted  Abdomen: Soft, gravid, appropriate for gestational age.  Pain/Pressure: Absent     Pelvic: Cervical exam deferred        Extremities: Normal range of motion.  Edema: None  Mental Status: Normal mood and affect. Normal behavior. Normal judgment and thought content.   Assessment and Plan:  Pregnancy: G2P0010 at 3076w5d  1. Encounter for supervision of normal first pregnancy in second trimester 28 wk labs and TDaP and flu today - HIV antibody - Glucose Tolerance, 2 Hours w/1 Hour - CBC - RPR - Tdap vaccine greater than or equal to 7yo IM  2. Need for immunization against influenza - Flu Vaccine QUAD 36+ mos  IM  Preterm labor symptoms and general obstetric precautions including but not limited to vaginal bleeding, contractions, leaking of fluid and fetal movement were reviewed in detail with the patient. Please refer to After Visit Summary for other counseling recommendations.  Return in 2 weeks (on 01/07/2018).  Future Appointments  Date Time Provider Department Center  01/21/2018 11:15 AM Runell GessBerry, Jonathan J, MD [facility]    Reva Boresanya S Isamu Trammel, MD

## 2017-12-25 LAB — CBC
Hematocrit: 31.6 % — ABNORMAL LOW (ref 34.0–46.6)
Hemoglobin: 9.9 g/dL — ABNORMAL LOW (ref 11.1–15.9)
MCH: 28.5 pg (ref 26.6–33.0)
MCHC: 31.3 g/dL — AB (ref 31.5–35.7)
MCV: 91 fL (ref 79–97)
Platelets: 274 10*3/uL (ref 150–450)
RBC: 3.47 x10E6/uL — ABNORMAL LOW (ref 3.77–5.28)
RDW: 14 % (ref 12.3–15.4)
WBC: 11.9 10*3/uL — ABNORMAL HIGH (ref 3.4–10.8)

## 2017-12-25 LAB — RPR: RPR: NONREACTIVE

## 2017-12-25 LAB — HIV ANTIBODY (ROUTINE TESTING W REFLEX): HIV SCREEN 4TH GENERATION: NONREACTIVE

## 2017-12-25 LAB — GLUCOSE TOLERANCE, 2 HOURS W/ 1HR
GLUCOSE, 1 HOUR: 109 mg/dL (ref 65–179)
GLUCOSE, 2 HOUR: 93 mg/dL (ref 65–152)
GLUCOSE, FASTING: 78 mg/dL (ref 65–91)

## 2017-12-26 ENCOUNTER — Encounter: Payer: Managed Care, Other (non HMO) | Admitting: Family Medicine

## 2017-12-30 ENCOUNTER — Encounter

## 2017-12-30 ENCOUNTER — Ambulatory Visit: Payer: Managed Care, Other (non HMO) | Admitting: Cardiovascular Disease

## 2018-01-07 ENCOUNTER — Encounter: Payer: Managed Care, Other (non HMO) | Admitting: Obstetrics and Gynecology

## 2018-01-09 ENCOUNTER — Ambulatory Visit (INDEPENDENT_AMBULATORY_CARE_PROVIDER_SITE_OTHER): Payer: Managed Care, Other (non HMO) | Admitting: Obstetrics and Gynecology

## 2018-01-09 VITALS — BP 126/81 | HR 72 | Wt 170.6 lb

## 2018-01-09 DIAGNOSIS — Z3400 Encounter for supervision of normal first pregnancy, unspecified trimester: Secondary | ICD-10-CM

## 2018-01-09 DIAGNOSIS — O99013 Anemia complicating pregnancy, third trimester: Secondary | ICD-10-CM

## 2018-01-09 DIAGNOSIS — O2342 Unspecified infection of urinary tract in pregnancy, second trimester: Secondary | ICD-10-CM

## 2018-01-09 DIAGNOSIS — R002 Palpitations: Secondary | ICD-10-CM

## 2018-01-09 NOTE — Progress Notes (Signed)
Prenatal Visit Note Date: 01/09/2018 Clinic: [facility]  Subjective:  Melanie Meadows is a 22 y.o. G2P0010 at [redacted]w[redacted]d being seen today for ongoing prenatal care.  She is currently monitored for the following issues for this low-risk pregnancy and has Encounter for supervision of normal pregnancy, unspecified, unspecified trimester; TB lung, latent; Bartholin's cyst; Asthma, mild intermittent; UTI (urinary tract infection) during pregnancy, second trimester; Anemia in pregnancy; and Palpitations on their problem list.  Patient reports no complaints.   Contractions: Not present.  .  Movement: Present. Denies leaking of fluid.   The following portions of the patient's history were reviewed and updated as appropriate: allergies, current medications, past family history, past medical history, past social history, past surgical history and problem list. Problem list updated.  Objective:   Vitals:   01/09/18 1052  BP: 126/81  Pulse: 72  Weight: 170 lb 9.6 oz (77.4 kg)    Fetal Status: Fetal Heart Rate (bpm): 152 Fundal Height: 33 cm Movement: Present     General:  Alert, oriented and cooperative. Patient is in no acute distress.  Skin: Skin is warm and dry. No rash noted.   Cardiovascular: Normal heart rate noted  Respiratory: Normal respiratory effort, no problems with respiration noted  Abdomen: Soft, gravid, appropriate for gestational age. Pain/Pressure: Absent     Pelvic:  Cervical exam deferred        Extremities: Normal range of motion.  Edema: None  Mental Status: Normal mood and affect. Normal behavior. Normal judgment and thought content.   Urinalysis:      Assessment and Plan:  Pregnancy: G2P0010 at [redacted]w[redacted]d  1. Palpitations HR 76 on auscultation. Followed by cards  2. Anemia during pregnancy in third trimester Recommend extra qday iron and vit c  3. Supervision of normal first pregnancy, antepartum Routine care. Follow [facility] - Culture, OB  Urine  4. UTI (urinary tract infection) during pregnancy, second trimester toc today - Culture, OB Urine  Preterm labor symptoms and general obstetric precautions including but not limited to vaginal bleeding, contractions, leaking of fluid and fetal movement were reviewed in detail with the patient. Please refer to After Visit Summary for other counseling recommendations.  Return in about 2 weeks (around 01/23/2018) for rob.   [place] Bing, MD

## 2018-01-12 LAB — URINE CULTURE, OB REFLEX

## 2018-01-12 LAB — CULTURE, OB URINE

## 2018-01-19 ENCOUNTER — Ambulatory Visit: Payer: Managed Care, Other (non HMO) | Admitting: Cardiovascular Disease

## 2018-01-21 ENCOUNTER — Ambulatory Visit (INDEPENDENT_AMBULATORY_CARE_PROVIDER_SITE_OTHER): Payer: Managed Care, Other (non HMO) | Admitting: Cardiovascular Disease

## 2018-01-21 ENCOUNTER — Encounter: Payer: Self-pay | Admitting: Cardiovascular Disease

## 2018-01-21 VITALS — BP 122/76 | HR 91 | Ht 66.0 in | Wt 177.8 lb

## 2018-01-21 DIAGNOSIS — R002 Palpitations: Secondary | ICD-10-CM

## 2018-01-21 NOTE — Progress Notes (Signed)
122/76

## 2018-01-23 ENCOUNTER — Ambulatory Visit (INDEPENDENT_AMBULATORY_CARE_PROVIDER_SITE_OTHER): Payer: Managed Care, Other (non HMO) | Admitting: Obstetrics and Gynecology

## 2018-01-23 VITALS — BP 115/73 | HR 103 | Wt 177.8 lb

## 2018-01-23 DIAGNOSIS — Z3480 Encounter for supervision of other normal pregnancy, unspecified trimester: Secondary | ICD-10-CM

## 2018-01-26 NOTE — Progress Notes (Signed)
Prenatal Visit Note Date: 01/23/2018 Clinic: [facility]  Subjective:  Melanie BeaverSarah Leanne Meadows is a 22 y.o. G2P0010 at 5887w0d being seen today for ongoing prenatal care.  She is currently monitored for the following issues for this low-risk pregnancy and has Encounter for supervision of normal pregnancy, unspecified, unspecified trimester; TB lung, latent; Bartholin's cyst; Asthma, mild intermittent; UTI (urinary tract infection) during pregnancy, second trimester; Anemia in pregnancy; and Palpitations on their problem list.  Patient reports no complaints.   Contractions: Not present.  .  Movement: Present. Denies leaking of fluid.   The following portions of the patient's history were reviewed and updated as appropriate: allergies, current medications, past family history, past medical history, past social history, past surgical history and problem list. Problem list updated.  Objective:   Vitals:   01/23/18 1107  BP: 115/73  Pulse: (!) 103  Weight: 177 lb 12.8 oz (80.6 kg)    Fetal Status: Fetal Heart Rate (bpm): 138 Fundal Height: 33 cm Movement: Present     General:  Alert, oriented and cooperative. Patient is in no acute distress.  Skin: Skin is warm and dry. No rash noted.   Cardiovascular: Normal heart rate noted  Respiratory: Normal respiratory effort, no problems with respiration noted  Abdomen: Soft, gravid, appropriate for gestational age. Pain/Pressure: Absent     Pelvic:  Cervical exam deferred        Extremities: Normal range of motion.  Edema: None  Mental Status: Normal mood and affect. Normal behavior. Normal judgment and thought content.   Urinalysis:      Assessment and Plan:  Pregnancy: G2P0010 at 7787w0d  Routine care. D/w pt more re: BC nv  Preterm labor symptoms and general obstetric precautions including but not limited to vaginal bleeding, contractions, leaking of fluid and fetal movement were reviewed in detail with the patient. Please  refer to After Visit Summary for other counseling recommendations.  Return in about 2 weeks (around 02/06/2018).   Warren BingPickens, Naydene Kamrowski, MD

## 2018-02-03 ENCOUNTER — Encounter: Payer: Self-pay | Admitting: Cardiovascular Disease

## 2018-02-04 ENCOUNTER — Ambulatory Visit: Payer: Managed Care, Other (non HMO)

## 2018-02-04 VITALS — BP 117/78 | HR 106

## 2018-02-04 DIAGNOSIS — R42 Dizziness and giddiness: Secondary | ICD-10-CM

## 2018-02-04 LAB — CBC
HEMATOCRIT: 26.9 % — AB (ref 34.0–46.6)
HEMOGLOBIN: 9 g/dL — AB (ref 11.1–15.9)
MCH: 28.1 pg (ref 26.6–33.0)
MCHC: 33.5 g/dL (ref 31.5–35.7)
MCV: 84 fL (ref 79–97)
Platelets: 295 10*3/uL (ref 150–450)
RBC: 3.2 x10E6/uL — ABNORMAL LOW (ref 3.77–5.28)
RDW: 12.7 % (ref 12.3–15.4)
WBC: 13.3 10*3/uL — ABNORMAL HIGH (ref 3.4–10.8)

## 2018-02-04 NOTE — Progress Notes (Signed)
Subjective:  Melanie Meadows is a 22 y.o. female here for BP check. Pt reports having a HA for 3 days. Patient alert and oriented, denies chest pain and difficulty breathing, does complain of blurred vision. Patient states she has been taking Tylenol.  Evaluated by Briscoe Deutscher  Blood pressure 117/78  Heart rate 106  Scheryl Marten, RN

## 2018-02-04 NOTE — Progress Notes (Addendum)
Ahri Olson Kreamer is a 22 y.o. G2P0010 at [redacted]w[redacted]d who presents to clinic requesting RN visit for BP check due to new onset headaches at home. 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 Patient also complains of intermittent dizziness upon her arrival at [facility]. Patient denies SOB, chest pain, syncope. Denies vaginal bleeding, leaking of fluid, decreased fetal movement, fever, falls, or recent illness.    Dizziness This is a recurring problem, onset "a few weeks go". 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  Headache This is a new problem, onset within the past few days. Pain is 4-6/10, bilateral and frontal. Does not radiate. No aggravating or alleviating factors. Denies visual disturbances but endorses blurry vision. Patient endorses history of migraines in high school. Does not typically manage with medication but took "400mg " of Tylenol q 4 hours throughout the day today. Endorses slight improvement.  PMH is significant for Anemia. Patient currently takes Iron q 2/3 days in addition to PNV. States she was advised to take daily but when she adds on third day she experiences severe constipation.  Past Medical History:  Diagnosis Date  . Family history of ovarian cancer    PT CANDIDATE FOR GENETIC TESTING AT AGE 37  . Immunization, viral disease    GARDASIL COMPLETED  . Ovarian cyst 05/2012   RUPTURED; SAW URGENT CARE  . Tuberculosis    latent tb    A/P Normotensive, slightly elevated pulse CBC collected Patient advised to report to [facility] or [facility] for further evaluation of dizziness, headache, anemia vs third trimester hemodilution and/or dehydration. Pt declines Patient strongly advised that she should not drive with current complaints of dizziness, blurry vision. Pt verbalized understanding Will follow up on  results of CBC PRN  F/U: OB appt next week  Clayton Bibles, [facility] 02/04/18 7:57 PM

## 2018-02-04 NOTE — Progress Notes (Signed)
cbc

## 2018-02-10 ENCOUNTER — Ambulatory Visit (INDEPENDENT_AMBULATORY_CARE_PROVIDER_SITE_OTHER): Payer: Managed Care, Other (non HMO) | Admitting: Obstetrics and Gynecology

## 2018-02-10 VITALS — BP 101/70 | HR 100 | Wt 179.8 lb

## 2018-02-10 DIAGNOSIS — Z3403 Encounter for supervision of normal first pregnancy, third trimester: Secondary | ICD-10-CM

## 2018-02-10 MED ORDER — MAGNESIUM OXIDE 400 (241.3 MG) MG PO TABS: ORAL_TABLET | ORAL | Status: DC

## 2018-02-10 NOTE — Progress Notes (Signed)
Prenatal Visit Note Date: 02/10/2018 Clinic: [facility]  Subjective:  Melanie Meadows is a 22 y.o. G2P0010 at [redacted]w[redacted]d being seen today for ongoing prenatal care.  She is currently monitored for the following issues for this low-risk pregnancy and has Encounter for supervision of normal pregnancy, unspecified, unspecified trimester; TB lung, latent; Bartholin's cyst; Asthma, mild intermittent; UTI (urinary tract infection) during pregnancy, second trimester; Anemia in pregnancy; and Palpitations on their problem list.  Patient reports has HAs but thinks that it's related to vision changes; pt wears glasses.   Contractions: Not present. Vag. Bleeding: None.  Movement: Present. Denies leaking of fluid.   The following portions of the patient's history were reviewed and updated as appropriate: allergies, current medications, past family history, past medical history, past social history, past surgical history and problem list. Problem list updated.  Objective:   Vitals:   02/10/18 1352  BP: 101/70  Pulse: 100  Weight: 179 lb 12.8 oz (81.6 kg)    Fetal Status: Fetal Heart Rate (bpm): 152 Fundal Height: 34 cm Movement: Present  Presentation: Vertex  General:  Alert, oriented and cooperative. Patient is in no acute distress.  Skin: Skin is warm and dry. No rash noted.   Cardiovascular: Normal heart rate noted  Respiratory: Normal respiratory effort, no problems with respiration noted  Abdomen: Soft, gravid, appropriate for gestational age. Pain/Pressure: Absent     Pelvic:  Cervical exam deferred        Extremities: Normal range of motion.  Edema: None  Mental Status: Normal mood and affect. Normal behavior. Normal judgment and thought content.   Urinalysis:      Assessment and Plan:  Pregnancy: G2P0010 at [redacted]w[redacted]d  Routine care. Breast. nexplanon vs IUD D/w her that normal to have some changes in visual acuity during pregnancy and may want to consider getting a  temp set of glasses. Also recommend trying mg ox 400 qday for HA to see if that helps and for constipation.  Preterm labor symptoms and general obstetric precautions including but not limited to vaginal bleeding, contractions, leaking of fluid and fetal movement were reviewed in detail with the patient. Please refer to After Visit Summary for other counseling recommendations.  Return in about 2 weeks (around 02/24/2018).   [place] Bing, MD

## 2018-02-25 ENCOUNTER — Encounter: Payer: Self-pay | Admitting: Family Medicine

## 2018-02-25 ENCOUNTER — Ambulatory Visit (INDEPENDENT_AMBULATORY_CARE_PROVIDER_SITE_OTHER): Payer: Managed Care, Other (non HMO) | Admitting: Family Medicine

## 2018-02-25 VITALS — BP 115/77 | HR 72 | Wt 183.2 lb

## 2018-02-25 DIAGNOSIS — Z3480 Encounter for supervision of other normal pregnancy, unspecified trimester: Secondary | ICD-10-CM

## 2018-02-25 MED ORDER — BREAST PUMP MISC: 0 refills | Status: DC

## 2018-02-25 NOTE — Progress Notes (Signed)
   PRENATAL VISIT NOTE  Subjective:  Melanie Meadows is a 22 y.o. G2P0010 at [redacted]w[redacted]d being seen today for ongoing prenatal care.  She is currently monitored for the following issues for this low-risk pregnancy and has Encounter for supervision of normal pregnancy, unspecified, unspecified trimester; TB lung, latent; Bartholin's cyst; Asthma, mild intermittent; UTI (urinary tract infection) during pregnancy, second trimester; Anemia in pregnancy; and Palpitations on their problem list.  Patient reports backache.  Contractions: Not present.  .  Movement: Present. Denies leaking of fluid.   The following portions of the patient's history were reviewed and updated as appropriate: allergies, current medications, past family history, past medical history, past social history, past surgical history and problem list. Problem list updated.  Objective:   Vitals:   02/25/18 1329  BP: 115/77  Pulse: 72  Weight: 183 lb 3.2 oz (83.1 kg)    Fetal Status: Fetal Heart Rate (bpm): 153   Movement: Present     General:  Alert, oriented and cooperative. Patient is in no acute distress.  Skin: Skin is warm and dry. No rash noted.   Cardiovascular: Normal heart rate noted  Respiratory: Normal respiratory effort, no problems with respiration noted  Abdomen: Soft, gravid, appropriate for gestational age.  Pain/Pressure: Absent     Pelvic: Cervical exam deferred        Extremities: Normal range of motion.  Edema: None  Mental Status: Normal mood and affect. Normal behavior. Normal judgment and thought content.   Assessment and Plan:  Pregnancy: G2P0010 at [redacted]w[redacted]d  1. Supervision of other normal pregnancy, antepartum - Desires delayed cord clamping, does want to avoid pain medication, does not epidural, would like to be med locked for IV. Desires to move around during labor. Planning on using birthing ball.  - will get 36 wk labs next visit - Misc. Devices (BREAST PUMP) MISC; Dispense one breast pump for  patient  Dispense: 1 each; Refill: 0  Preterm labor symptoms and general obstetric precautions including but not limited to vaginal bleeding, contractions, leaking of fluid and fetal movement were reviewed in detail with the patient. Please refer to After Visit Summary for other counseling recommendations.  No follow-ups on file.  Future Appointments  Date Time Provider Department Center  03/04/2018 10:00 AM Calvert Cantor, [place] [facility]  03/10/2018 10:00 AM Anyanwu, Jethro Bastos, MD [facility]  03/16/2018  2:00 PM Anyanwu, Jethro Bastos, MD [facility]    Federico Flake, MD

## 2018-02-25 NOTE — Patient Instructions (Signed)

## 2018-02-26 ENCOUNTER — Encounter (HOSPITAL_COMMUNITY): Payer: Self-pay

## 2018-02-26 ENCOUNTER — Other Ambulatory Visit: Payer: Self-pay

## 2018-02-26 ENCOUNTER — Inpatient Hospital Stay (HOSPITAL_COMMUNITY)
Admission: AD | Admit: 2018-02-26 | Discharge: 2018-02-26 | Disposition: A | Discharge status: Home / Self Care | Payer: Managed Care, Other (non HMO) | Source: Ambulatory Visit | Attending: Obstetrics and Gynecology | Admitting: Obstetrics and Gynecology

## 2018-02-26 DIAGNOSIS — O368130 Decreased fetal movements, third trimester, not applicable or unspecified: Secondary | ICD-10-CM | POA: Diagnosis present

## 2018-02-26 DIAGNOSIS — O2343 Unspecified infection of urinary tract in pregnancy, third trimester: Secondary | ICD-10-CM | POA: Insufficient documentation

## 2018-02-26 DIAGNOSIS — O26893 Other specified pregnancy related conditions, third trimester: Secondary | ICD-10-CM | POA: Diagnosis not present

## 2018-02-26 DIAGNOSIS — O4703 False labor before 37 completed weeks of gestation, third trimester: Secondary | ICD-10-CM | POA: Diagnosis not present

## 2018-02-26 DIAGNOSIS — R0602 Shortness of breath: Secondary | ICD-10-CM | POA: Diagnosis not present

## 2018-02-26 DIAGNOSIS — Z3689 Encounter for other specified antenatal screening: Secondary | ICD-10-CM

## 2018-02-26 DIAGNOSIS — Z3A35 35 weeks gestation of pregnancy: Secondary | ICD-10-CM | POA: Insufficient documentation

## 2018-02-26 HISTORY — DX: Unspecified infectious disease: B99.9

## 2018-02-26 HISTORY — DX: Headache: R51

## 2018-02-26 HISTORY — DX: Anemia, unspecified: D64.9

## 2018-02-26 HISTORY — DX: Headache, unspecified: R51.9

## 2018-02-26 HISTORY — DX: Calculus of kidney: N20.0

## 2018-02-26 LAB — URINALYSIS, ROUTINE W REFLEX MICROSCOPIC
Bilirubin Urine: NEGATIVE
Glucose, UA: NEGATIVE mg/dL
HGB URINE DIPSTICK: NEGATIVE
KETONES UR: NEGATIVE mg/dL
NITRITE: NEGATIVE
Protein, ur: NEGATIVE mg/dL
SPECIFIC GRAVITY, URINE: 1.016 (ref 1.005–1.030)
WBC, UA: >50 WBC/hpf — ABNORMAL HIGH (ref 0–5)
pH: 6 (ref 5.0–8.0)

## 2018-02-26 MED ORDER — ALUM & MAG HYDROXIDE-SIMETH 200-200-20 MG/5ML PO SUSP
30.0000 mL | Freq: Once | ORAL | Status: AC
  Administered 2018-02-26: 30 mL via ORAL
  Filled 2018-02-26: qty 30

## 2018-02-26 MED ORDER — LIDOCAINE VISCOUS HCL 2 % MT SOLN
15.0000 mL | Freq: Once | OROMUCOSAL | Status: AC
  Administered 2018-02-26: 15 mL via ORAL
  Filled 2018-02-26: qty 15

## 2018-02-26 MED ORDER — NIFEDIPINE 10 MG PO CAPS
10.0000 mg | ORAL_CAPSULE | ORAL | Status: DC | PRN
  Administered 2018-02-26 (×2): 10 mg via ORAL
  Filled 2018-02-26 (×2): qty 1

## 2018-02-26 MED ORDER — CEPHALEXIN 500 MG PO CAPS: 500.0000 mg | ORAL_CAPSULE | Freq: Four times a day (QID) | ORAL | 2 refills | Status: DC

## 2018-02-26 NOTE — MAU Provider Note (Signed)
Chief Complaint:  Decreased Fetal Movement and Contractions   First Provider Initiated Contact with Patient 02/26/18 1824      HPI: Melanie Meadows is a 22 y.o. G2P0010 at [redacted]w[redacted]d who presents to maternity admissions reporting shortness of breath and contractions starting this morning. Shortness of breath was intermittent, only with contractions, and is now constant, even at rest.  Her contractions are low in her abdomen, intermittent cramping pain unresolved with increased PO fluids and rest.  They are increasing in frequency and intensity since onset.  She denies leakage of fluid today but reports some light leakage requiring a panty liner yesterday after intercourse.  She denies any urinary symptoms today, but reports IV abx for kidney infection earlier during the pregnancy.  While in MAU, she reports some chest tightness/pressure.  She reports frequent heartburn but is not sure this is related.  She denies any respiratory symptoms or sick contacts. She reports good fetal movement, vaginal bleeding, vaginal itching/burning, urinary symptoms, h/a, dizziness, n/v, or fever/chills.    HPI  Past Medical History: Past Medical History:  Diagnosis Date  . Anemia   . Family history of ovarian cancer    PT CANDIDATE FOR GENETIC TESTING AT AGE 20  . Headache   . Immunization, viral disease    GARDASIL COMPLETED  . Infection    UTI  . Kidney stone   . Ovarian cyst 05/2012   RUPTURED; SAW URGENT CARE  . Tuberculosis    latent tb     Past obstetric history: OB History  Gravida Para Term Preterm AB Living  2 0 0 0 1 0  SAB TAB Ectopic Multiple Live Births  1 0 0 0 0    # Outcome Date GA Lbr Len/2nd Weight Sex Delivery Anes PTL Lv  2 Current           1 SAB 03/07/15 [redacted]w[redacted]d    SAB       Past Surgical History: Past Surgical History:  Procedure Laterality Date  . NO PAST SURGERIES      Family History: Family History  Problem Relation Age of Onset  . Cancer Paternal Grandmother      Social History: Social History   Tobacco Use  . Smoking status: Never Smoker  . Smokeless tobacco: Never Used  Substance Use Topics  . Alcohol use: No  . Drug use: No    Allergies:  Allergies  Allergen Reactions  . Pork-Derived Products     Meds:  Medications Prior to Admission  Medication Sig Dispense Refill Last Dose  . albuterol (PROVENTIL HFA;VENTOLIN HFA) 108 (90 Base) MCG/ACT inhaler Inhale 2 puffs into the lungs every 6 (six) hours as needed for wheezing or shortness of breath. 1 Inhaler 2 Taking  . ferrous sulfate 325 (65 FE) MG tablet Take 1 tablet (325 mg total) by mouth 2 (two) times daily with a meal. 60 tablet 3 Taking  . magnesium oxide (MAGOX 400) 400 (241.3 Mg) MG tablet One pill by mouth as needed for headaches.   Taking  . Misc. Devices (BREAST PUMP) MISC Dispense one breast pump for patient 1 each 0   . Peak Flow Meter DEVI Measure peak flow daily or if short of breath.  If <350cc (which is 80% of your expected), take 2 puffs of albuterol rescue inhaler and reassess in 20 mins.  If persistently <350 after 2 rescue attempts, present to hospital for evaluation. 1 each 0 Taking  . Prenatal MV-Min-FA-Omega-3 (PRENATAL GUMMIES/DHA & FA PO) Take  by mouth.   Taking    ROS:  Review of Systems  Constitutional: Negative for chills, fatigue and fever.  Eyes: Negative for visual disturbance.  Respiratory: Positive for chest tightness and shortness of breath.   Cardiovascular: Positive for chest pain.  Gastrointestinal: Positive for abdominal pain. Negative for nausea and vomiting.  Genitourinary: Positive for pelvic pain. Negative for difficulty urinating, dysuria, flank pain, vaginal bleeding, vaginal discharge and vaginal pain.  Musculoskeletal: Positive for back pain.  Neurological: Negative for dizziness and headaches.  Psychiatric/Behavioral: Negative.      I have reviewed patient's Past Medical Hx, Surgical Hx, Family Hx, Social Hx, medications and  allergies.   Physical Exam   Patient Vitals for the past 24 hrs:  BP Temp Pulse Resp Height Weight  02/26/18 1943 121/85 - 91 - - -  02/26/18 1845 122/75 - 98 - - -  02/26/18 1748 114/88 - (!) 115 - - -  02/26/18 1722 (!) 149/84 98.1 F (36.7 C) (!) 110 16 5\' 6"  (1.676 m) 83 kg   Constitutional: Well-developed, well-nourished female in no acute distress.  HEART: normal rate, heart sounds, regular rhythm RESP: normal effort, lung sounds clear and equal bilaterally GI: Abd soft, non-tender, gravid appropriate for gestational age.  MS: Extremities nontender, no edema, normal ROM Neurologic: Alert and oriented x 4.  GU: Neg CVAT.  PELVIC EXAM: Cervix pink, visually closed, without lesion, no pooling with valsalva, scant white creamy discharge, vaginal walls and external genitalia normal   Dilation: Fingertip Effacement (%): Thick Cervical Position: Posterior Exam by:: Collene Gobble CNM  FHT:  Baseline 130 , moderate variability, accelerations present, no decelerations Contractions: q 3-8 mins, mild to palpation   Labs: Results for orders placed or performed during the hospital encounter of 02/26/18 (from the past 24 hour(s))  Urinalysis, Routine w reflex microscopic     Status: Abnormal   Collection Time: 02/26/18  5:36 PM  Result Value Ref Range   Color, Urine YELLOW YELLOW   APPearance HAZY (A) CLEAR   Specific Gravity, Urine 1.016 1.005 - 1.030   pH 6.0 5.0 - 8.0   Glucose, UA NEGATIVE NEGATIVE mg/dL   Hgb urine dipstick NEGATIVE NEGATIVE   Bilirubin Urine NEGATIVE NEGATIVE   Ketones, ur NEGATIVE NEGATIVE mg/dL   Protein, ur NEGATIVE NEGATIVE mg/dL   Nitrite NEGATIVE NEGATIVE   Leukocytes, UA SMALL (A) NEGATIVE   RBC / HPF 0-5 0 - 5 RBC/hpf   WBC, UA >50 (H) 0 - 5 WBC/hpf   Bacteria, UA RARE (A) NONE SEEN   Squamous Epithelial / LPF 0-5 0 - 5   Mucus PRESENT    AB/Positive/-- (05/08 1655)  EKG: normal EKG, normal sinus rhythm.  Imaging:  No results  found.  MAU Course/MDM: UA with leukocytes, no nitrites, may be evidence of UTI NST reviewed and reactive EKG normal sinus rhythm, heart and lung sounds clear Pt with frequent painful contractions but cervix 0.5 cm and thick with no evidence of labor Procardia 10 mg x 2 doses 20 minutes apart given with total resolution of contractions Pt shortness of breath improved significantly and chest pain resolved after Procardia and improved contractions Consult Dr Jolayne Panther with presentation, exam findings and test results.  D/C home, treat for UTI with Keflex Pt discharge with strict preterm labor/return precautions.  Today's evaluation included a work-up for preterm labor which can be life-threatening for both mom and baby.  Assessment:  1. Shortness of breath due to pregnancy in third trimester  2. Decreased fetal movements in third trimester, single or unspecified fetus   3. Threatened preterm labor, third trimester   4. UTI (urinary tract infection) during pregnancy, third trimester   5. NST (non-stress test) reactive    Plan: Discharge home Labor precautions and fetal kick counts   Allergies as of 02/26/2018      Reactions   Pork-derived Products       Medication List    TAKE these medications   albuterol 108 (90 Base) MCG/ACT inhaler Commonly known as:  PROVENTIL HFA;VENTOLIN HFA Inhale 2 puffs into the lungs every 6 (six) hours as needed for wheezing or shortness of breath.   Breast Pump Misc Dispense one breast pump for patient   cephALEXin 500 MG capsule Commonly known as:  KEFLEX Take 1 capsule (500 mg total) by mouth 4 (four) times daily.   ferrous sulfate 325 (65 FE) MG tablet Take 1 tablet (325 mg total) by mouth 2 (two) times daily with a meal.   magnesium oxide 400 (241.3 Mg) MG tablet Commonly known as:  MAG-OX One pill by mouth as needed for headaches.   Peak Flow Meter Devi Measure peak flow daily or if short of breath.  If <350cc (which is 80% of  your expected), take 2 puffs of albuterol rescue inhaler and reassess in 20 mins.  If persistently <350 after 2 rescue attempts, present to hospital for evaluation.   PRENATAL GUMMIES/DHA & FA PO Take by mouth.       Sharen Counter Certified Nurse-Midwife 02/26/2018 7:53 PM

## 2018-02-26 NOTE — MAU Note (Signed)
Pt presents to MAU with complaints of irregular contractions throughout the day with a decrease in fetal movement today

## 2018-03-04 ENCOUNTER — Ambulatory Visit (INDEPENDENT_AMBULATORY_CARE_PROVIDER_SITE_OTHER): Payer: Managed Care, Other (non HMO) | Admitting: Advanced Practice Midwife

## 2018-03-04 ENCOUNTER — Other Ambulatory Visit (HOSPITAL_COMMUNITY)
Admission: RE | Admit: 2018-03-04 | Discharge: 2018-03-04 | Disposition: A | Discharge status: Home / Self Care | Payer: Managed Care, Other (non HMO) | Source: Ambulatory Visit | Attending: Advanced Practice Midwife | Admitting: Advanced Practice Midwife

## 2018-03-04 VITALS — BP 118/82 | HR 132 | Temp 98.0°F | Wt 184.0 lb

## 2018-03-04 DIAGNOSIS — O2343 Unspecified infection of urinary tract in pregnancy, third trimester: Secondary | ICD-10-CM

## 2018-03-04 DIAGNOSIS — Z3483 Encounter for supervision of other normal pregnancy, third trimester: Secondary | ICD-10-CM | POA: Diagnosis not present

## 2018-03-04 LAB — OB RESULTS CONSOLE GC/CHLAMYDIA: GC PROBE AMP, GENITAL: NEGATIVE

## 2018-03-04 NOTE — Patient Instructions (Signed)
Vaginal Delivery Vaginal delivery means that you will give birth by pushing your baby out of your birth canal (vagina). A team of health care providers will help you before, during, and after vaginal delivery. Birth experiences are unique for every woman and every pregnancy, and birth experiences vary depending on where you choose to give birth. What should I do to prepare for my baby's birth? Before your baby is born, it is important to talk with your health care provider about:  Your labor and delivery preferences. These may include: ? Medicines that you may be given. ? How you will manage your pain. This might include non-medical pain relief techniques or injectable pain relief such as epidural analgesia. ? How you and your baby will be monitored during labor and delivery. ? Who may be in the labor and delivery room with you. ? Your feelings about surgical delivery of your baby (cesarean delivery, or C-section) if this becomes necessary. ? Your feelings about receiving donated blood through an IV tube (blood transfusion) if this becomes necessary.  Whether you are able: ? To take pictures or videos of the birth. ? To eat during labor and delivery. ? To move around, walk, or change positions during labor and delivery.  What to expect after your baby is born, such as: ? Whether delayed umbilical cord clamping and cutting is offered. ? Who will care for your baby right after birth. ? Medicines or tests that may be recommended for your baby. ? Whether breastfeeding is supported in your hospital or birth center. ? How long you will be in the hospital or birth center.  How any medical conditions you have may affect your baby or your labor and delivery experience.  To prepare for your baby's birth, you should also:  Attend all of your health care visits before delivery (prenatal visits) as recommended by your health care provider. This is important.  Prepare your home for your baby's  arrival. Make sure that you have: ? Diapers. ? Baby clothing. ? Feeding equipment. ? Safe sleeping arrangements for you and your baby.  Install a car seat in your vehicle. Have your car seat checked by a certified car seat installer to make sure that it is installed safely.  Think about who will help you with your new baby at home for at least the first several weeks after delivery.  What can I expect when I arrive at the birth center or hospital? Once you are in labor and have been admitted into the hospital or birth center, your health care provider may:  Review your pregnancy history and any concerns you have.  Insert an IV tube into one of your veins. This is used to give you fluids and medicines.  Check your blood pressure, pulse, temperature, and heart rate (vital signs).  Check whether your bag of water (amniotic sac) has broken (ruptured).  Talk with you about your birth plan and discuss pain control options.  Monitoring Your health care provider may monitor your contractions (uterine monitoring) and your baby's heart rate (fetal monitoring). You may need to be monitored:  Often, but not continuously (intermittently).  All the time or for long periods at a time (continuously). Continuous monitoring may be needed if: ? You are taking certain medicines, such as medicine to relieve pain or make your contractions stronger. ? You have pregnancy or labor complications.  Monitoring may be done by:  Placing a special stethoscope or a handheld monitoring device on your abdomen to   check your baby's heartbeat, and feeling your abdomen for contractions. This method of monitoring does not continuously record your baby's heartbeat or your contractions.  Placing monitors on your abdomen (external monitors) to record your baby's heartbeat and the frequency and length of contractions. You may not have to wear external monitors all the time.  Placing monitors inside of your uterus  (internal monitors) to record your baby's heartbeat and the frequency, length, and strength of your contractions. ? Your health care provider may use internal monitors if he or she needs more information about the strength of your contractions or your baby's heart rate. ? Internal monitors are put in place by passing a thin, flexible wire through your vagina and into your uterus. Depending on the type of monitor, it may remain in your uterus or on your baby's head until birth. ? Your health care provider will discuss the benefits and risks of internal monitoring with you and will ask for your permission before inserting the monitors.  Telemetry. This is a type of continuous monitoring that can be done with external or internal monitors. Instead of having to stay in bed, you are able to move around during telemetry. Ask your health care provider if telemetry is an option for you.  Physical exam Your health care provider may perform a physical exam. This may include:  Checking whether your baby is positioned: ? With the head toward your vagina (head-down). This is most common. ? With the head toward the top of your uterus (head-up or breech). If your baby is in a breech position, your health care provider may try to turn your baby to a head-down position so you can deliver vaginally. If it does not seem that your baby can be born vaginally, your provider may recommend surgery to deliver your baby. In rare cases, you may be able to deliver vaginally if your baby is head-up (breech delivery). ? Lying sideways (transverse). Babies that are lying sideways cannot be delivered vaginally.  Checking your cervix to determine: ? Whether it is thinning out (effacing). ? Whether it is opening up (dilating). ? How low your baby has moved into your birth canal.  What are the three stages of labor and delivery?  Normal labor and delivery is divided into the following three stages: Stage 1  Stage 1 is the  longest stage of labor, and it can last for hours or days. Stage 1 includes: ? Early labor. This is when contractions may be irregular, or regular and mild. Generally, early labor contractions are more than 10 minutes apart. ? Active labor. This is when contractions get longer, more regular, more frequent, and more intense. ? The transition phase. This is when contractions happen very close together, are very intense, and may last longer than during any other part of labor.  Contractions generally feel mild, infrequent, and irregular at first. They get stronger, more frequent (about every 2-3 minutes), and more regular as you progress from early labor through active labor and transition.  Many women progress through stage 1 naturally, but you may need help to continue making progress. If this happens, your health care provider may talk with you about: ? Rupturing your amniotic sac if it has not ruptured yet. ? Giving you medicine to help make your contractions stronger and more frequent.  Stage 1 ends when your cervix is completely dilated to 4 inches (10 cm) and completely effaced. This happens at the end of the transition phase. Stage 2  Once   your cervix is completely effaced and dilated to 4 inches (10 cm), you may start to feel an urge to push. It is common for the body to naturally take a rest before feeling the urge to push, especially if you received an epidural or certain other pain medicines. This rest period may last for up to 1-2 hours, depending on your unique labor experience.  During stage 2, contractions are generally less painful, because pushing helps relieve contraction pain. Instead of contraction pain, you may feel stretching and burning pain, especially when the widest part of your baby's head passes through the vaginal opening (crowning).  Your health care provider will closely monitor your pushing progress and your baby's progress through the vagina during stage 2.  Your  health care provider may massage the area of skin between your vaginal opening and anus (perineum) or apply warm compresses to your perineum. This helps it stretch as the baby's head starts to crown, which can help prevent perineal tearing. ? In some cases, an incision may be made in your perineum (episiotomy) to allow the baby to pass through the vaginal opening. An episiotomy helps to make the opening of the vagina larger to allow more room for the baby to fit through.  It is very important to breathe and focus so your health care provider can control the delivery of your baby's head. Your health care provider may have you decrease the intensity of your pushing, to help prevent perineal tearing.  After delivery of your baby's head, the shoulders and the rest of the body generally deliver very quickly and without difficulty.  Once your baby is delivered, the umbilical cord may be cut right away, or this may be delayed for 1-2 minutes, depending on your baby's health. This may vary among health care providers, hospitals, and birth centers.  If you and your baby are healthy enough, your baby may be placed on your chest or abdomen to help maintain the baby's temperature and to help you bond with each other. Some mothers and babies start breastfeeding at this time. Your health care team will dry your baby and help keep your baby warm during this time.  Your baby may need immediate care if he or she: ? Showed signs of distress during labor. ? Has a medical condition. ? Was born too early (prematurely). ? Had a bowel movement before birth (meconium). ? Shows signs of difficulty transitioning from being inside the uterus to being outside of the uterus. If you are planning to breastfeed, your health care team will help you begin a feeding. Stage 3  The third stage of labor starts immediately after the birth of your baby and ends after you deliver the placenta. The placenta is an organ that develops  during pregnancy to provide oxygen and nutrients to your baby in the womb.  Delivering the placenta may require some pushing, and you may have mild contractions. Breastfeeding can stimulate contractions to help you deliver the placenta.  After the placenta is delivered, your uterus should tighten (contract) and become firm. This helps to stop bleeding in your uterus. To help your uterus contract and to control bleeding, your health care provider may: ? Give you medicine by injection, through an IV tube, by mouth, or through your rectum (rectally). ? Massage your abdomen or perform a vaginal exam to remove any blood clots that are left in your uterus. ? Empty your bladder by placing a thin, flexible tube (catheter) into your bladder. ? Encourage   you to breastfeed your baby. After labor is over, you and your baby will be monitored closely to ensure that you are both healthy until you are ready to go home. Your health care team will teach you how to care for yourself and your baby. This information is not intended to replace advice given to you by your health care provider. Make sure you discuss any questions you have with your health care provider. Document Released: 01/30/2008 Document Revised: 11/10/2015 Document Reviewed: 05/07/2015 Elsevier Interactive Patient Education  2018 [employer]  

## 2018-03-04 NOTE — Progress Notes (Signed)
   PRENATAL VISIT NOTE  Subjective:  Melanie Meadows is a 22 y.o. G2P0010 at [redacted]w[redacted]d being seen today for ongoing prenatal care.  She is currently monitored for the following issues for this low-risk pregnancy and has Encounter for supervision of normal pregnancy, unspecified, unspecified trimester; TB lung, latent; Bartholin's cyst; Asthma, mild intermittent; UTI (urinary tract infection) during pregnancy, second trimester; Anemia in pregnancy; and Palpitations on their problem list.  Patient reports no complaints. Dizziness is significantly reduced from previous visits.  Contractions: routinely feeling mild contractions which resolve overnight. Vag. Bleeding: None.  Movement: Present. Denies leaking of fluid.   The following portions of the patient's history were reviewed and updated as appropriate: allergies, current medications, past family history, past medical history, past social history, past surgical history and problem list. Problem list updated.  Objective:   Vitals:   03/04/18 1015  BP: 118/82  Pulse: (!) 132  Temp: 98 F (36.7 C)  Weight: 184 lb (83.5 kg)    Fetal Status: Fetal Heart Rate (bpm): 152 Fundal Height: 38 cm Movement: Present  Presentation: Vertex  General:  Alert, oriented and cooperative. Patient is in no acute distress.  Skin: Skin is warm and dry. No rash noted.   Cardiovascular: Normal heart rate noted  Respiratory: Normal respiratory effort, no problems with respiration noted  Abdomen: Soft, gravid, appropriate for gestational age.  Pain/Pressure: Absent     Pelvic: Cervical exam deferred        Extremities: Normal range of motion.  Edema: Trace  Mental Status: Normal mood and affect. Normal behavior. Normal judgment and thought content.   Assessment and Plan:  Pregnancy: G2P0010 at [redacted]w[redacted]d  1. Encounter for supervision of other normal pregnancy in third trimester - No complaints or concerns, continue routine care - Patient states her employer is  strongly encouraging her to start maternity leave now. Clarified the start of her leave is between patient and employer, she does not have medical indication at this time. - Reviewed signs of labor, symptoms which would require triage in MAU - Strep Gp B NAA - Cervicovaginal ancillary only  2. UTI in pregnancy, antepartum, third trimester - Identified in MAU 02/26/18, patient has not started prescribed Keflex. Will pick up today  Preterm labor symptoms and general obstetric precautions including but not limited to vaginal bleeding, contractions, leaking of fluid and fetal movement were reviewed in detail with the patient. Please refer to After Visit Summary for other counseling recommendations.  Return in about 1 week (around 03/11/2018).  Future Appointments  Date Time Provider Department Center  03/10/2018 10:00 AM Anyanwu, Jethro Bastos, MD [facility]  03/16/2018  2:00 PM Anyanwu, Jethro Bastos, MD [facility]    Calvert Cantor, CNM

## 2018-03-05 LAB — CERVICOVAGINAL ANCILLARY ONLY
CHLAMYDIA, DNA PROBE: NEGATIVE
NEISSERIA GONORRHEA: NEGATIVE

## 2018-03-06 LAB — STREP GP B NAA: Strep Gp B NAA: NEGATIVE

## 2018-03-10 ENCOUNTER — Encounter: Payer: Self-pay | Admitting: *Deleted

## 2018-03-10 ENCOUNTER — Ambulatory Visit (INDEPENDENT_AMBULATORY_CARE_PROVIDER_SITE_OTHER): Payer: Managed Care, Other (non HMO) | Admitting: Obstetrics & Gynecology

## 2018-03-10 ENCOUNTER — Encounter: Payer: Self-pay | Admitting: Obstetrics & Gynecology

## 2018-03-10 VITALS — BP 120/81 | HR 102 | Wt 184.0 lb

## 2018-03-10 DIAGNOSIS — Z3483 Encounter for supervision of other normal pregnancy, third trimester: Secondary | ICD-10-CM

## 2018-03-10 NOTE — Progress Notes (Signed)
   PRENATAL VISIT NOTE  Subjective:  Melanie Meadows is a 22 y.o. G2P0010 at [redacted]w[redacted]d being seen today for ongoing prenatal care.  She is currently monitored for the following issues for this low-risk pregnancy and has Encounter for supervision of normal pregnancy in third trimester; TB lung, latent; Asthma, mild intermittent; UTI (urinary tract infection) during pregnancy, second trimester; Anemia in pregnancy; and Palpitations on their problem list.  Patient reports no complaints.  Contractions: Not present. Vag. Bleeding: None.  Movement: Present. Denies leaking of fluid.   The following portions of the patient's history were reviewed and updated as appropriate: allergies, current medications, past family history, past medical history, past social history, past surgical history and problem list. Problem list updated.  Objective:   Vitals:   03/10/18 1009  BP: 120/81  Pulse: (!) 102  Weight: 184 lb (83.5 kg)    Fetal Status: Fetal Heart Rate (bpm): 155 Fundal Height: 38 cm Movement: Present  Presentation: Vertex (verified by limited bedside ultrasound)  General:  Alert, oriented and cooperative. Patient is in no acute distress.  Skin: Skin is warm and dry. No rash noted.   Cardiovascular: Normal heart rate noted  Respiratory: Normal respiratory effort, no problems with respiration noted  Abdomen: Soft, gravid, appropriate for gestational age.  Pain/Pressure: Absent     Pelvic: Cervical exam deferred        Extremities: Normal range of motion.  Edema: Trace  Mental Status: Normal mood and affect. Normal behavior. Normal judgment and thought content.   Results for orders placed or performed in visit on 03/04/18 (from the past 168 hour(s))  Cervicovaginal ancillary only   Collection Time: 03/04/18 12:00 AM  Result Value Ref Range   Chlamydia Negative    Neisseria gonorrhea Negative   Strep Gp B NAA   Collection Time: 03/04/18 10:15 AM  Result Value Ref Range   Strep Gp B NAA  Negative Negative    Assessment and Plan:  Pregnancy: G2P0010 at [redacted]w[redacted]d  1. Encounter for supervision of other normal pregnancy in third trimester Negative pelvic cultures, discussed with patient. Term labor symptoms and general obstetric precautions including but not limited to vaginal bleeding, contractions, leaking of fluid and fetal movement were reviewed in detail with the patient. Please refer to After Visit Summary for other counseling recommendations.  Return in about 1 week (around 03/17/2018) for OB Visit.  Future Appointments  Date Time Provider Department Center  03/16/2018  2:00 PM Kaimen Peine, Jethro Bastos, MD [facility]    Jaynie Collins, MD

## 2018-03-10 NOTE — Patient Instructions (Signed)
Return to clinic for any scheduled appointments or obstetric concerns, or go to MAU for evaluation  

## 2018-03-16 ENCOUNTER — Ambulatory Visit (INDEPENDENT_AMBULATORY_CARE_PROVIDER_SITE_OTHER): Payer: Managed Care, Other (non HMO) | Admitting: Obstetrics & Gynecology

## 2018-03-16 VITALS — BP 113/76 | HR 93 | Wt 189.0 lb

## 2018-03-16 DIAGNOSIS — Z3483 Encounter for supervision of other normal pregnancy, third trimester: Secondary | ICD-10-CM

## 2018-03-16 NOTE — Patient Instructions (Signed)
Return to clinic for any scheduled appointments or obstetric concerns, or go to MAU for evaluation  

## 2018-03-16 NOTE — Progress Notes (Signed)
   PRENATAL VISIT NOTE  Subjective:  Melanie Meadows is a 22 y.o. G2P0010 at [redacted]w[redacted]d being seen today for ongoing prenatal care.  She is currently monitored for the following issues for this low-risk pregnancy and has Encounter for supervision of normal pregnancy in third trimester; TB lung, latent; Asthma, mild intermittent; UTI (urinary tract infection) during pregnancy, second trimester; Anemia in pregnancy; and Palpitations on their problem list.  Patient reports no complaints.  Contractions: Irregular. Vag. Bleeding: None.  Movement: Present. Denies leaking of fluid.   The following portions of the patient's history were reviewed and updated as appropriate: allergies, current medications, past family history, past medical history, past social history, past surgical history and problem list. Problem list updated.  Objective:   Vitals:   03/16/18 1406  BP: 113/76  Pulse: 93  Weight: 189 lb (85.7 kg)    Fetal Status: Fetal Heart Rate (bpm): 140 Fundal Height: 39 cm Movement: Present  Presentation: Vertex  General:  Alert, oriented and cooperative. Patient is in no acute distress.  Skin: Skin is warm and dry. No rash noted.   Cardiovascular: Normal heart rate noted  Respiratory: Normal respiratory effort, no problems with respiration noted  Abdomen: Soft, gravid, appropriate for gestational age.  Pain/Pressure: Absent     Pelvic: Cervical exam deferred Dilation: Closed Effacement (%): Thick Station: Ballotable  Extremities: Normal range of motion.  Edema: Trace  Mental Status: Normal mood and affect. Normal behavior. Normal judgment and thought content.   Assessment and Plan:  Pregnancy: G2P0010 at [redacted]w[redacted]d  1. Encounter for supervision of other normal pregnancy in third trimester IOL for postdates booked 04/04/18 at 0830. Term labor symptoms and general obstetric precautions including but not limited to vaginal bleeding, contractions, leaking of fluid and fetal movement were  reviewed in detail with the patient. Please refer to After Visit Summary for other counseling recommendations.  Return in about 1 week (around 03/23/2018) for OB Visit.  Future Appointments  Date Time Provider Department Center  04/04/2018  8:00 AM [facility] None    Jaynie Collins, MD

## 2018-03-18 ENCOUNTER — Inpatient Hospital Stay (HOSPITAL_COMMUNITY): Payer: Managed Care, Other (non HMO) | Admitting: Anesthesiology

## 2018-03-18 ENCOUNTER — Encounter (HOSPITAL_COMMUNITY): Payer: Self-pay | Admitting: *Deleted

## 2018-03-18 ENCOUNTER — Inpatient Hospital Stay (HOSPITAL_COMMUNITY)
Admission: AD | Admit: 2018-03-18 | Discharge: 2018-03-20 | DRG: 807 | Disposition: A | Discharge status: Home / Self Care | Payer: Managed Care, Other (non HMO) | Attending: Obstetrics & Gynecology | Admitting: Obstetrics & Gynecology

## 2018-03-18 DIAGNOSIS — Z227 Latent tuberculosis: Secondary | ICD-10-CM | POA: Diagnosis present

## 2018-03-18 DIAGNOSIS — O9902 Anemia complicating childbirth: Secondary | ICD-10-CM | POA: Diagnosis present

## 2018-03-18 DIAGNOSIS — Z3A41 41 weeks gestation of pregnancy: Secondary | ICD-10-CM

## 2018-03-18 DIAGNOSIS — J4520 Mild intermittent asthma, uncomplicated: Secondary | ICD-10-CM | POA: Diagnosis present

## 2018-03-18 DIAGNOSIS — Z3A38 38 weeks gestation of pregnancy: Secondary | ICD-10-CM

## 2018-03-18 DIAGNOSIS — O4202 Full-term premature rupture of membranes, onset of labor within 24 hours of rupture: Secondary | ICD-10-CM | POA: Diagnosis not present

## 2018-03-18 DIAGNOSIS — R002 Palpitations: Secondary | ICD-10-CM | POA: Diagnosis present

## 2018-03-18 DIAGNOSIS — Z3483 Encounter for supervision of other normal pregnancy, third trimester: Secondary | ICD-10-CM

## 2018-03-18 DIAGNOSIS — O9952 Diseases of the respiratory system complicating childbirth: Secondary | ICD-10-CM | POA: Diagnosis present

## 2018-03-18 DIAGNOSIS — O480 Post-term pregnancy: Secondary | ICD-10-CM | POA: Diagnosis present

## 2018-03-18 DIAGNOSIS — D649 Anemia, unspecified: Secondary | ICD-10-CM | POA: Diagnosis present

## 2018-03-18 DIAGNOSIS — O4292 Full-term premature rupture of membranes, unspecified as to length of time between rupture and onset of labor: Principal | ICD-10-CM | POA: Diagnosis present

## 2018-03-18 DIAGNOSIS — O99019 Anemia complicating pregnancy, unspecified trimester: Secondary | ICD-10-CM | POA: Diagnosis present

## 2018-03-18 DIAGNOSIS — Z3493 Encounter for supervision of normal pregnancy, unspecified, third trimester: Secondary | ICD-10-CM

## 2018-03-18 HISTORY — DX: Latent tuberculosis: Z22.7

## 2018-03-18 HISTORY — DX: Unspecified asthma, uncomplicated: J45.909

## 2018-03-18 LAB — CBC
HEMATOCRIT: 31.1 % — AB (ref 36.0–46.0)
Hemoglobin: 9.6 g/dL — ABNORMAL LOW (ref 12.0–15.0)
MCH: 26.6 pg (ref 26.0–34.0)
MCHC: 30.9 g/dL (ref 30.0–36.0)
MCV: 86.1 fL (ref 80.0–100.0)
Platelets: 262 10*3/uL (ref 150–400)
RBC: 3.61 MIL/uL — ABNORMAL LOW (ref 3.87–5.11)
RDW: 15 % (ref 11.5–15.5)
WBC: 15.3 10*3/uL — AB (ref 4.0–10.5)
nRBC: 0 % (ref 0.0–0.2)

## 2018-03-18 LAB — TYPE AND SCREEN
ABO/RH(D): AB POS
ANTIBODY SCREEN: NEGATIVE

## 2018-03-18 LAB — POCT FERN TEST: POCT FERN TEST: POSITIVE

## 2018-03-18 LAB — ABO/RH: ABO/RH(D): AB POS

## 2018-03-18 MED ORDER — PHENYLEPHRINE 40 MCG/ML (10ML) SYRINGE FOR IV PUSH (FOR BLOOD PRESSURE SUPPORT)
80.0000 ug | PREFILLED_SYRINGE | INTRAVENOUS | Status: DC | PRN
  Administered 2018-03-19: 80 ug via INTRAVENOUS
  Filled 2018-03-18: qty 5

## 2018-03-18 MED ORDER — PHENYLEPHRINE 40 MCG/ML (10ML) SYRINGE FOR IV PUSH (FOR BLOOD PRESSURE SUPPORT)
80.0000 ug | PREFILLED_SYRINGE | INTRAVENOUS | Status: DC | PRN
  Administered 2018-03-19: 80 ug via INTRAVENOUS
  Filled 2018-03-18: qty 5
  Filled 2018-03-18: qty 10

## 2018-03-18 MED ORDER — LACTATED RINGERS IV SOLN: 500.0000 mL | Freq: Once | INTRAVENOUS | Status: DC

## 2018-03-18 MED ORDER — EPHEDRINE 5 MG/ML INJ
10.0000 mg | INTRAVENOUS | Status: DC | PRN
  Filled 2018-03-18: qty 2

## 2018-03-18 MED ORDER — MISOPROSTOL 50MCG HALF TABLET
50.0000 ug | ORAL_TABLET | ORAL | Status: DC | PRN
  Administered 2018-03-18: 50 ug via BUCCAL
  Filled 2018-03-18 (×2): qty 1

## 2018-03-18 MED ORDER — SODIUM BICARBONATE 8.4 % IV SOLN
INTRAVENOUS | Status: DC | PRN
  Administered 2018-03-18: 8 mL via EPIDURAL

## 2018-03-18 MED ORDER — MISOPROSTOL 25 MCG QUARTER TABLET
25.0000 ug | ORAL_TABLET | ORAL | Status: DC | PRN
  Administered 2018-03-18: 25 ug via VAGINAL
  Filled 2018-03-18: qty 1

## 2018-03-18 MED ORDER — LIDOCAINE HCL (PF) 1 % IJ SOLN
30.0000 mL | INTRAMUSCULAR | Status: DC | PRN
  Filled 2018-03-18: qty 30

## 2018-03-18 MED ORDER — DIPHENHYDRAMINE HCL 50 MG/ML IJ SOLN: 12.5000 mg | INTRAMUSCULAR | Status: DC | PRN

## 2018-03-18 MED ORDER — LIDOCAINE HCL (PF) 1 % IJ SOLN
INTRAMUSCULAR | Status: DC | PRN
  Administered 2018-03-18 (×2): 4 mL via EPIDURAL

## 2018-03-18 MED ORDER — ACETAMINOPHEN 325 MG PO TABS: 650.0000 mg | ORAL_TABLET | ORAL | Status: DC | PRN

## 2018-03-18 MED ORDER — SOD CITRATE-CITRIC ACID 500-334 MG/5ML PO SOLN: 30.0000 mL | ORAL | Status: DC | PRN

## 2018-03-18 MED ORDER — MISOPROSTOL 25 MCG QUARTER TABLET: 25.0000 ug | ORAL_TABLET | ORAL | Status: DC | PRN

## 2018-03-18 MED ORDER — TERBUTALINE SULFATE 1 MG/ML IJ SOLN
0.2500 mg | Freq: Once | INTRAMUSCULAR | Status: DC | PRN
  Filled 2018-03-18: qty 1

## 2018-03-18 MED ORDER — OXYTOCIN BOLUS FROM INFUSION
500.0000 mL | Freq: Once | INTRAVENOUS | Status: AC
  Administered 2018-03-19: 500 mL via INTRAVENOUS

## 2018-03-18 MED ORDER — OXYTOCIN 40 UNITS IN LACTATED RINGERS INFUSION - SIMPLE MED: 1.0000 m[IU]/min | INTRAVENOUS | Status: DC

## 2018-03-18 MED ORDER — OXYCODONE-ACETAMINOPHEN 5-325 MG PO TABS: 2.0000 | ORAL_TABLET | ORAL | Status: DC | PRN

## 2018-03-18 MED ORDER — LACTATED RINGERS IV SOLN
500.0000 mL | Freq: Once | INTRAVENOUS | Status: AC
  Administered 2018-03-18: 500 mL via INTRAVENOUS

## 2018-03-18 MED ORDER — FENTANYL CITRATE (PF) 100 MCG/2ML IJ SOLN
100.0000 ug | INTRAMUSCULAR | Status: DC | PRN
  Administered 2018-03-18 (×2): 100 ug via INTRAVENOUS
  Filled 2018-03-18 (×2): qty 2

## 2018-03-18 MED ORDER — FLEET ENEMA 7-19 GM/118ML RE ENEM: 1.0000 | ENEMA | Freq: Every day | RECTAL | Status: DC | PRN

## 2018-03-18 MED ORDER — LACTATED RINGERS IV SOLN: 500.0000 mL | INTRAVENOUS | Status: DC | PRN

## 2018-03-18 MED ORDER — FENTANYL CITRATE (PF) 100 MCG/2ML IJ SOLN: 50.0000 ug | INTRAMUSCULAR | Status: DC | PRN

## 2018-03-18 MED ORDER — ZOLPIDEM TARTRATE 5 MG PO TABS: 5.0000 mg | ORAL_TABLET | Freq: Every evening | ORAL | Status: DC | PRN

## 2018-03-18 MED ORDER — HYDROXYZINE HCL 50 MG PO TABS
50.0000 mg | ORAL_TABLET | Freq: Four times a day (QID) | ORAL | Status: DC | PRN
  Filled 2018-03-18: qty 1

## 2018-03-18 MED ORDER — ONDANSETRON HCL 4 MG/2ML IJ SOLN
4.0000 mg | Freq: Four times a day (QID) | INTRAMUSCULAR | Status: DC | PRN
  Administered 2018-03-19: 4 mg via INTRAVENOUS
  Filled 2018-03-18: qty 2

## 2018-03-18 MED ORDER — FENTANYL 2.5 MCG/ML BUPIVACAINE 1/10 % EPIDURAL INFUSION (WH - ANES)
14.0000 mL/h | INTRAMUSCULAR | Status: DC | PRN
  Administered 2018-03-18: 14 mL/h via EPIDURAL
  Filled 2018-03-18: qty 100

## 2018-03-18 MED ORDER — LACTATED RINGERS IV SOLN
INTRAVENOUS | Status: DC
  Administered 2018-03-18: 125 mL/h via INTRAVENOUS
  Administered 2018-03-18 – 2018-03-19 (×2): via INTRAVENOUS

## 2018-03-18 MED ORDER — OXYTOCIN 40 UNITS IN LACTATED RINGERS INFUSION - SIMPLE MED
2.5000 [IU]/h | INTRAVENOUS | Status: DC
  Filled 2018-03-18 (×2): qty 1000

## 2018-03-18 MED ORDER — OXYCODONE-ACETAMINOPHEN 5-325 MG PO TABS: 1.0000 | ORAL_TABLET | ORAL | Status: DC | PRN

## 2018-03-18 NOTE — Anesthesia Procedure Notes (Signed)
Epidural Patient location during procedure: OB Start time: 03/18/2018 11:42 PM End time: 03/18/2018 11:49 PM  Staffing Anesthesiologist: Mal AmabileFoster, Laurna Shetley, MD Performed: anesthesiologist   Preanesthetic Checklist Completed: patient identified, site marked, surgical consent, pre-op evaluation, timeout performed, IV checked, risks and benefits discussed and monitors and equipment checked  Epidural Patient position: sitting Prep: site prepped and draped and DuraPrep Patient monitoring: continuous pulse ox and blood pressure Approach: midline Location: L3-L4 Injection technique: LOR air  Needle:  Needle type: Tuohy  Needle gauge: 17 G Needle length: 9 cm and 9 Needle insertion depth: 5 cm cm Catheter type: closed end flexible Catheter size: 19 Gauge Catheter at skin depth: 10 cm Test dose: negative and Other  Assessment Events: blood not aspirated, injection not painful, no injection resistance, negative IV test and no paresthesia  Additional Notes Patient identified. Risks and benefits discussed including failed block, incomplete  Pain control, post dural puncture headache, nerve damage, paralysis, blood pressure Changes, nausea, vomiting, reactions to medications-both toxic and allergic and post Partum back pain. All questions were answered. Patient expressed understanding and wished to proceed. Sterile technique was used throughout procedure. Epidural site was Dressed with sterile barrier dressing. No paresthesias, signs of intravascular injection Or signs of intrathecal spread were encountered.  Patient was more comfortable after the epidural was dosed. Please see RN's note for documentation of vital signs and FHR which are stable.

## 2018-03-18 NOTE — MAU Provider Note (Signed)
None      S: Ms. Lynann BeaverSarah Leanne Gendron is a 22 y.o. G2P0010 at 36109w5d  who presents to MAU today complaining of leaking of fluid since 0730. She denies vaginal bleeding. She denies contractions. She reports normal fetal movement.    O: BP 112/80   Pulse 98   Temp 98.4 F (36.9 C) (Oral)   Resp 18   Wt 85.7 kg   LMP 06/20/2017   BMI 30.51 kg/m  GENERAL: Well-developed, well-nourished female in no acute distress.  HEAD: Normocephalic, atraumatic.  CHEST: Normal effort of breathing, regular heart rate ABDOMEN: Soft, nontender, gravid PELVIC: Normal external female genitalia. Vagina is pink and rugated. Cervix with normal contour, no lesions. Visually 1 cm/thick.  Normal discharge.  positive pooling.   Cervical exam:  Deferred  Fetal Monitoring: Baseline: 135 Variability: moderate Accelerations: present Decelerations: none Contractions: rare, mild to palpation  Results for orders placed or performed during the hospital encounter of 03/18/18 (from the past 24 hour(s))  POCT fern test     Status: Normal   Collection Time: 03/18/18 11:01 AM  Result Value Ref Range   POCT Fern Test Positive = ruptured amniotic membanes   CBC     Status: Abnormal   Collection Time: 03/18/18 11:09 AM  Result Value Ref Range   WBC 15.3 (H) 4.0 - 10.5 K/uL   RBC 3.61 (L) 3.87 - 5.11 MIL/uL   Hemoglobin 9.6 (L) 12.0 - 15.0 g/dL   HCT 09.831.1 (L) 11.936.0 - 14.746.0 %   MCV 86.1 80.0 - 100.0 fL   MCH 26.6 26.0 - 34.0 pg   MCHC 30.9 30.0 - 36.0 g/dL   RDW 82.915.0 56.211.5 - 13.015.5 %   Platelets 262 150 - 400 K/uL   nRBC 0.0 0.0 - 0.2 %  Type and screen     Status: None   Collection Time: 03/18/18 11:09 AM  Result Value Ref Range   ABO/RH(D) AB POS    Antibody Screen NEG    Sample Expiration      03/21/2018 Performed at [facility], [address] [place], [place] [address]   ABO/Rh     Status: None   Collection Time: 03/18/18 11:09 AM  Result Value Ref Range   ABO/RH(D)      AB POS Performed at  [facility], [address] [place], [place] [address]      A: SIUP at 71109w5d  SROM  GBS negative  [facility]  Expectant management on admission Consider augmentation PRN  Kendell BaneLeftwich-Kirby, Lakeva Hollon A, CNM 03/18/2018 5:57 PM

## 2018-03-18 NOTE — Progress Notes (Signed)
LABOR PROGRESS NOTE  Melanie BeaverSarah Leanne Meadows is a 22 y.o. G2P0010 at 3031w5d  admitted for IOL due to PROM.   Subjective: Doing well, some cramping.   Objective: BP 122/75   Pulse (!) 107   Temp 98.2 F (36.8 C) (Oral)   Resp 20   Wt 85.7 kg   LMP 06/20/2017   BMI 30.51 kg/m  or  Vitals:   03/18/18 1031 03/18/18 1213 03/18/18 1221  BP: 126/80 122/75   Pulse: (!) 112 (!) 107   Resp: 18 20   Temp: 98.2 F (36.8 C) 98.2 F (36.8 C)   TempSrc:  Oral   Weight:   85.7 kg    Dilation: 1 Effacement (%): Thick Cervical Position: Posterior Station: -3 Presentation: Vertex(by ultrasound) Exam by:: abraham FHT: baseline rate 130, moderate varibility, +acel, -decel Toco: Irregular, every 4-6 min   Labs: Lab Results  Component Value Date   WBC 15.3 (H) 03/18/2018   HGB 9.6 (L) 03/18/2018   HCT 31.1 (L) 03/18/2018   MCV 86.1 03/18/2018   PLT 262 03/18/2018    Patient Active Problem List   Diagnosis Date Noted  . Post term pregnancy at [redacted] weeks gestation 03/18/2018  . Palpitations 12/10/2017  . Anemia in pregnancy 11/28/2017  . UTI (urinary tract infection) during pregnancy, second trimester 11/21/2017  . Asthma, mild intermittent 10/07/2017  . Encounter for supervision of normal pregnancy in third trimester 09/10/2017  . TB lung, latent 09/10/2017    Assessment / Plan: 22 y.o. G2P0010 at 4031w5d here for IOL due to PROM this morning. GBS negative.   Labor: IOL, s/p cytotec x1. Progressing well, placed FB and vaginal cytotec @ 1700 Fetal Wellbeing:  Cat 1 strip  Pain Control:  Desire natural delivery  Anticipated MOD:  NSVD   Leticia PennaSamantha Beard, D.O. Family Medicine PGY-1  03/18/2018, 5:04 PM

## 2018-03-18 NOTE — H&P (Addendum)
LABOR AND DELIVERY ADMISSION HISTORY AND PHYSICAL NOTE  Melanie Meadows is a 22 y.o. female G2P0010 with IUP at [redacted]w[redacted]d by early U/S presenting for PROM. Reports ROM around 730 this morning, confirmed fern positive with clear fluid in the MAU. She reports positive fetal movement. She denies HA, abdominal pain, or vaginal bleeding.  Prenatal History/Complications: PNC at [facility]  Pregnancy complications:  - Latent TB exposure in 2015- Quant gold positive, took rifampin for 1.5 months (became pregnant), negative CXR x2 and asymptomatic. - Anemia of pregnancy - Intermittent mild asthma - has never used an albuterol inhaler, no problems since high school   Past Medical History: Past Medical History:  Diagnosis Date  . Anemia   . Bartholin's cyst 09/10/2017   Not-infected; have MD re-evaluate at next OB appt  . Family history of ovarian cancer    PT CANDIDATE FOR GENETIC TESTING AT AGE 75  . Headache   . Immunization, viral disease    GARDASIL COMPLETED  . Infection    UTI  . Kidney stone   . Ovarian cyst 05/2012   RUPTURED; SAW URGENT CARE  . Tuberculosis    latent tb     Past Surgical History: Past Surgical History:  Procedure Laterality Date  . NO PAST SURGERIES      Obstetrical History: OB History    Gravida  2   Para  0   Term  0   Preterm  0   AB  1   Living  0     SAB  1   TAB  0   Ectopic  0   Multiple  0   Live Births  0           Social History: Social History   Socioeconomic History  . Marital status: Single    Spouse name: Not on file  . Number of children: Not on file  . Years of education: Not on file  . Highest education level: Not on file  Occupational History  . Not on file  Social Needs  . Financial resource strain: Not on file  . Food insecurity:    Worry: Not on file    Inability: Not on file  . Transportation needs:    Medical: Not on file    Non-medical: Not on file  Tobacco Use  . Smoking status: Never Smoker   . Smokeless tobacco: Never Used  Substance and Sexual Activity  . Alcohol use: No  . Drug use: No  . Sexual activity: Yes    Partners: Male    Comment: pregnant  Lifestyle  . Physical activity:    Days per week: Not on file    Minutes per session: Not on file  . Stress: Not on file  Relationships  . Social connections:    Talks on phone: Not on file    Gets together: Not on file    Attends religious service: Not on file    Active member of club or organization: Not on file    Attends meetings of clubs or organizations: Not on file    Relationship status: Not on file  Other Topics Concern  . Not on file  Social History Narrative  . Not on file    Family History: Family History  Problem Relation Age of Onset  . Cancer Paternal Grandmother     Allergies: Allergies  Allergen Reactions  . Pork-Derived Products     Medications Prior to Admission  Medication Sig Dispense Refill Last  Dose  . albuterol (PROVENTIL HFA;VENTOLIN HFA) 108 (90 Base) MCG/ACT inhaler Inhale 2 puffs into the lungs every 6 (six) hours as needed for wheezing or shortness of breath. 1 Inhaler 2 Taking  . cephALEXin (KEFLEX) 500 MG capsule Take 1 capsule (500 mg total) by mouth 4 (four) times daily. (Patient not taking: Reported on 03/16/2018) 28 capsule 2 Not Taking  . ferrous sulfate 325 (65 FE) MG tablet Take 1 tablet (325 mg total) by mouth 2 (two) times daily with a meal. 60 tablet 3 Taking  . magnesium oxide (MAGOX 400) 400 (241.3 Mg) MG tablet One pill by mouth as needed for headaches.   Taking  . Misc. Devices (BREAST PUMP) MISC Dispense one breast pump for patient 1 each 0 Taking  . Peak Flow Meter DEVI Measure peak flow daily or if short of breath.  If <350cc (which is 80% of your expected), take 2 puffs of albuterol rescue inhaler and reassess in 20 mins.  If persistently <350 after 2 rescue attempts, present to hospital for evaluation. 1 each 0 Taking  . Prenatal MV-Min-FA-Omega-3 (PRENATAL  GUMMIES/DHA & FA PO) Take by mouth.   Taking     Review of Systems  All systems reviewed and negative except as stated in HPI  Physical Exam Blood pressure 126/80, pulse (!) 112, temperature 98.2 F (36.8 C), resp. rate 18, last menstrual period 06/20/2017. General appearance: alert, oriented, NAD Lungs: normal respiratory effort Heart: regular rate Abdomen: soft, non-tender; gravid, FH appropriate for GA Extremities: No calf swelling or tenderness Presentation: cephalic Fetal monitoring: baseline 130's, mod var, + accel, - decel Uterine activity: Irritable, irregular     Prenatal labs: ABO, Rh: AB/Positive/-- (05/08 1655) Antibody: Negative (05/08 1655) Rubella: 1.11 (05/08 1655) RPR: Non Reactive (08/21 0847)  HBsAg: Negative (05/08 1655)  HIV: Non Reactive (08/21 0847)  GC/Chlamydia: Negative  GBS: Negative (10/30 1015)  2-hr GTT: Normal  Genetic screening: Normal  Anatomy US: Normal female   Prenatal Transfer Tool  Maternal Diabetes: No Genetic Screening: Normal Maternal Ultrasounds/Referrals: Normal Fetal Ultrasounds or other Referrals:  None Maternal Substance Abuse:  No Significant Maternal Medications:  None Significant Maternal Lab Results: Lab values include: Group B Strep negative  Results for orders placed or performed during the hospital encounter of 03/18/18 (from the past 24 hour(s))  POCT fern test   Collection Time: 03/18/18 11:01 AM  Result Value Ref Range   POCT Fern Test Positive = ruptured amniotic membanes     Patient Active Problem List   Diagnosis Date Noted  . Palpitations 12/10/2017  . Anemia in pregnancy 11/28/2017  . UTI (urinary tract infection) during pregnancy, second trimester 11/21/2017  . Asthma, mild intermittent 10/07/2017  . Encounter for supervision of normal pregnancy in third trimester 09/10/2017  . TB lung, latent 09/10/2017    Assessment: Melanie Meadows is a 22 y.o. G2P0010 at 1183w5d here for IOL due to PROM.  Pregnancy complicated by latent TB with negative CXR x2 and asymptomatic. GBS negative.   #Labor: IOL for PROM with cytotec x1, may attempt FB placement if necessary on next cervical check. Patient wanting to limit amount of inducing medications if possible.   #Pain: Wants natural delivery  #FWB: Cat 1 strip  #ID: GBS negative  #MOF: Breastfeeding #MOC: IUD #Circ: N/A  Allayne StackSamantha N Beard 03/18/2018, 11:32 AM   OB FELLOW HISTORY AND PHYSICAL ATTESTATION  I have seen and examined this patient; I agree with above documentation in the resident's note.  Gwenevere Abbot, MD OB Fellow  03/18/2018, 8:36 PM

## 2018-03-18 NOTE — Anesthesia Preprocedure Evaluation (Signed)
Anesthesia Evaluation  Patient identified by MRN, date of birth, ID band Patient awake    Reviewed: Allergy & Precautions, Patient's Chart, lab work & pertinent test results  Airway Mallampati: II  TM Distance: >3 FB Neck ROM: Full    Dental no notable dental hx. (+) Teeth Intact   Pulmonary asthma ,  Latent TB   Pulmonary exam normal breath sounds clear to auscultation       Cardiovascular negative cardio ROS Normal cardiovascular exam Rhythm:Regular Rate:Normal     Neuro/Psych  Headaches, negative psych ROS   GI/Hepatic Neg liver ROS, GERD  ,  Endo/Other  negative endocrine ROS  Renal/GU Renal diseaseHx/o renal calculi  negative genitourinary   Musculoskeletal negative musculoskeletal ROS (+)   Abdominal (+) - obese,   Peds  Hematology  (+) anemia ,   Anesthesia Other Findings   Reproductive/Obstetrics (+) Pregnancy                             Anesthesia Physical Anesthesia Plan  ASA: II  Anesthesia Plan: Epidural   Post-op Pain Management:    Induction:   PONV Risk Score and Plan:   Airway Management Planned: Natural Airway  Additional Equipment:   Intra-op Plan:   Post-operative Plan:   Informed Consent: I have reviewed the patients History and Physical, chart, labs and discussed the procedure including the risks, benefits and alternatives for the proposed anesthesia with the patient or authorized representative who has indicated his/her understanding and acceptance.     Plan Discussed with: Anesthesiologist  Anesthesia Plan Comments:         Anesthesia Quick Evaluation

## 2018-03-18 NOTE — Progress Notes (Signed)
OB/GYN Faculty Practice: Labor Progress Note  Subjective: Feeling stronger contractions every 2-3 minutes since the FB came out.   Objective: BP 131/82   Pulse 96   Temp 98.4 F (36.9 C) (Oral)   Resp 20   Wt 85.7 kg   LMP 06/20/2017   BMI 30.51 kg/m  Gen: uncomfortable appearing  Dilation: 4.5 Effacement (%): Thick Cervical Position: Posterior Station: -3 Presentation: Vertex Exam by:: Grover CanavanBailey Bumgarner, RN  Assessment and Plan: 22 y.o. G2P0010 3133w5d admitted for PROM.   Labor: s/p cytotec x2, FB out -- will hold off on pitocin until after next check in 2 hours as patient is painfully contracting  -- pain control: IV fentanyl, does not want an epidural   Fetal Well-Being: Cephalic by prior exam  -- Category 1 - continuous fetal monitoring  -- GBS negative  Burman NievesJulia Najmah Carradine, MD Family Medicine Resident 9:27 PM

## 2018-03-18 NOTE — MAU Note (Signed)
Pt present to MAU with complaints of SROM at 0730 this morning. Mild contractions

## 2018-03-19 ENCOUNTER — Encounter (HOSPITAL_COMMUNITY): Payer: Self-pay | Admitting: *Deleted

## 2018-03-19 DIAGNOSIS — Z3A38 38 weeks gestation of pregnancy: Secondary | ICD-10-CM

## 2018-03-19 DIAGNOSIS — O4202 Full-term premature rupture of membranes, onset of labor within 24 hours of rupture: Secondary | ICD-10-CM

## 2018-03-19 LAB — RPR: RPR Ser Ql: NONREACTIVE

## 2018-03-19 MED ORDER — ZOLPIDEM TARTRATE 5 MG PO TABS: 5.0000 mg | ORAL_TABLET | Freq: Every evening | ORAL | Status: DC | PRN

## 2018-03-19 MED ORDER — TETANUS-DIPHTH-ACELL PERTUSSIS 5-2.5-18.5 LF-MCG/0.5 IM SUSP: 0.5000 mL | Freq: Once | INTRAMUSCULAR | Status: DC

## 2018-03-19 MED ORDER — COCONUT OIL OIL
1.0000 "application " | TOPICAL_OIL | Status: DC | PRN
  Administered 2018-03-20: 1 via TOPICAL
  Filled 2018-03-19: qty 120

## 2018-03-19 MED ORDER — SENNOSIDES-DOCUSATE SODIUM 8.6-50 MG PO TABS
2.0000 | ORAL_TABLET | ORAL | Status: DC
  Administered 2018-03-19: 2 via ORAL
  Filled 2018-03-19: qty 2

## 2018-03-19 MED ORDER — ONDANSETRON HCL 4 MG/2ML IJ SOLN: 4.0000 mg | INTRAMUSCULAR | Status: DC | PRN

## 2018-03-19 MED ORDER — DIBUCAINE 1 % RE OINT: 1.0000 "application " | TOPICAL_OINTMENT | RECTAL | Status: DC | PRN

## 2018-03-19 MED ORDER — DIPHENHYDRAMINE HCL 25 MG PO CAPS: 25.0000 mg | ORAL_CAPSULE | Freq: Four times a day (QID) | ORAL | Status: DC | PRN

## 2018-03-19 MED ORDER — ONDANSETRON HCL 4 MG PO TABS: 4.0000 mg | ORAL_TABLET | ORAL | Status: DC | PRN

## 2018-03-19 MED ORDER — SIMETHICONE 80 MG PO CHEW: 80.0000 mg | CHEWABLE_TABLET | ORAL | Status: DC | PRN

## 2018-03-19 MED ORDER — IBUPROFEN 600 MG PO TABS
600.0000 mg | ORAL_TABLET | Freq: Four times a day (QID) | ORAL | Status: DC
  Administered 2018-03-19 – 2018-03-20 (×5): 600 mg via ORAL
  Filled 2018-03-19 (×5): qty 1

## 2018-03-19 MED ORDER — WITCH HAZEL-GLYCERIN EX PADS: 1.0000 "application " | MEDICATED_PAD | CUTANEOUS | Status: DC | PRN

## 2018-03-19 MED ORDER — ACETAMINOPHEN 325 MG PO TABS
650.0000 mg | ORAL_TABLET | ORAL | Status: DC | PRN
  Administered 2018-03-20: 650 mg via ORAL
  Filled 2018-03-19: qty 2

## 2018-03-19 MED ORDER — BENZOCAINE-MENTHOL 20-0.5 % EX AERO
1.0000 "application " | INHALATION_SPRAY | CUTANEOUS | Status: DC | PRN
  Administered 2018-03-19 – 2018-03-20 (×2): 1 via TOPICAL
  Filled 2018-03-19 (×2): qty 56

## 2018-03-19 MED ORDER — PRENATAL MULTIVITAMIN CH
1.0000 | ORAL_TABLET | Freq: Every day | ORAL | Status: DC
  Administered 2018-03-19 – 2018-03-20 (×2): 1 via ORAL
  Filled 2018-03-19 (×2): qty 1

## 2018-03-19 NOTE — Anesthesia Postprocedure Evaluation (Signed)
Anesthesia Post Note  Patient: Melanie Meadows  Procedure(s) Performed: AN AD HOC LABOR EPIDURAL     Patient location during evaluation: Mother Baby Anesthesia Type: Epidural Level of consciousness: awake Pain management: satisfactory to patient Vital Signs Assessment: post-procedure vital signs reviewed and stable Respiratory status: spontaneous breathing Cardiovascular status: stable Anesthetic complications: no    Last Vitals:  Vitals:   03/19/18 1310 03/19/18 1705  BP: 107/70 117/75  Pulse: 88 81  Resp: 18 19  Temp: 37 C 36.7 C  SpO2:  100%    Last Pain:  Vitals:   03/19/18 1705  TempSrc: Oral  PainSc:    Pain Goal:                 KeyCorpBURGER,[name]

## 2018-03-19 NOTE — Progress Notes (Signed)
OB/GYN Faculty Practice: Tracing Note  Objective: BP (!) 95/50 (BP Location: Left Arm) Comment: pt not symptomatic   Pulse (!) 107   Temp 98 F (36.7 C) (Oral)   Resp 16   Wt 85.7 kg   LMP 06/20/2017   SpO2 99%   BMI 30.51 kg/m   Dilation: 8 Effacement (%): 90 Cervical Position: Posterior Station: -2, -1 Presentation: Vertex Exam by:: Beitz RN    FHR: baseline 115, moderate variability, 15x15 accels, no decels Uterine Activity: contractions every 2-3 minutes  Assessment and Plan: 22 y.o. G2P0010 1833w6d admitted for PROM.   Labor: s/p cytotec x2, FB out -- holding pitocin as patient is adequately contracting and making cervical change -- pain control: IV fentanyl, does not want an epidural   Fetal Well-Being: Cephalic by prior exam  -- Category 1 - continuous fetal monitoring  -- GBS negative  Burman NievesJulia Taylin Leder, MD Family Medicine Resident 4:31 AM

## 2018-03-20 NOTE — Discharge Instructions (Signed)

## 2018-03-20 NOTE — Discharge Summary (Signed)
OB Discharge Summary     [facility] DOB: 08-10-1995 MRN: [id number]  Date of admission: 03/18/2018 Delivering MD: Burman NievesABRAHAM, JULIA A   Date of discharge: 03/20/2018  Admitting diagnosis: 38wks, water broke, ctx 5 min Intrauterine pregnancy: 2536w6d     Secondary diagnosis:  Principal Problem:   Post term pregnancy at [redacted] weeks gestation Active Problems:   Encounter for supervision of normal pregnancy in third trimester   TB lung, latent   Asthma, mild intermittent   Anemia in pregnancy   Palpitations  Additional problems: none     Discharge diagnosis: Term Pregnancy Delivered                                                                                                Post partum procedures:none  Augmentation: AROM, Cytotec and Foley Balloon  Complications: None  Hospital course:  Onset of Labor With Vaginal Delivery     22 y.o. yo G2P1011 at 7236w6d was admitted in Latent Labor on 03/18/2018. Patient had an uncomplicated labor course as follows:  Membrane Rupture Time/Date: 7:30 AM ,03/18/2018   Intrapartum Procedures: Episiotomy: None [1]                                         Lacerations:  Labial [10]  Patient had a delivery of a Viable infant. 03/19/2018  Information for the patient's newborn:  Melanie Meadows, Girl Maralyn SagoSarah [[id number]][[id number]]  Delivery Method: Vag-Spont    Pateint had an uncomplicated postpartum course.  She is ambulating, tolerating a regular diet, passing flatus, and urinating well. Patient is discharged home in stable condition on 03/20/18.   Physical exam  Vitals:   03/19/18 1310 03/19/18 1705 03/19/18 2056 03/20/18 0537  BP: 107/70 117/75 117/70 118/79  Pulse: 88 81 100 89  Resp: 18 19 18 20   Temp: 98.6 F (37 C) 98.1 F (36.7 C) 98.2 F (36.8 C) 97.7 F (36.5 C)  TempSrc: Oral Oral Oral Oral  SpO2:  100% 99% 100%  Weight:       General: alert, cooperative and no distress Lochia: appropriate Uterine Fundus: firm Incision:  N/A DVT Evaluation: No evidence of DVT seen on physical exam. Labs: Lab Results  Component Value Date   WBC 15.3 (H) 03/18/2018   HGB 9.6 (L) 03/18/2018   HCT 31.1 (L) 03/18/2018   MCV 86.1 03/18/2018   PLT 262 03/18/2018   CMP Latest Ref Rng & Units 11/21/2017  Glucose 65 - 99 mg/dL 98  BUN 6 - 20 mg/dL 6  Creatinine 7.820.57 - 9.561.00 mg/dL 2.13(Y0.47(L)  Sodium 865134 - 784144 mmol/L 137  Potassium 3.5 - 5.2 mmol/L 4.2  Chloride 96 - 106 mmol/L 104  CO2 20 - 29 mmol/L 21  Calcium 8.7 - 10.2 mg/dL 8.7  Total Protein 6.5 - 8.1 g/dL -  Total Bilirubin 0.3 - 1.2 mg/dL -  Alkaline Phos 38 - 696126 U/L -  AST 15 - 41 U/L -  ALT 14 - 54 U/L -  Discharge instruction: per After Visit Summary and "Baby and Me Booklet".  After visit meds:  Allergies as of 03/20/2018      Reactions   Pork-derived Products Nausea Only, Swelling, Rash      Medication List    TAKE these medications   acetaminophen 500 MG tablet Commonly known as:  TYLENOL Take 1,000 mg by mouth every 6 (six) hours as needed for mild pain or headache.   albuterol 108 (90 Base) MCG/ACT inhaler Commonly known as:  PROVENTIL HFA;VENTOLIN HFA Inhale 2 puffs into the lungs every 6 (six) hours as needed for wheezing or shortness of breath.   Breast Pump Misc Dispense one breast pump for patient   calcium carbonate 500 MG chewable tablet Commonly known as:  TUMS - dosed in mg elemental calcium Chew 1 tablet by mouth as needed for indigestion or heartburn. Up to 6 times daily as needed.   ferrous sulfate 325 (65 FE) MG tablet Take 1 tablet (325 mg total) by mouth 2 (two) times daily with a meal.   magnesium oxide 400 (241.3 Mg) MG tablet Commonly known as:  MAG-OX One pill by mouth as needed for headaches.   Peak Flow Meter Devi Measure peak flow daily or if short of breath.  If <350cc (which is 80% of your expected), take 2 puffs of albuterol rescue inhaler and reassess in 20 mins.  If persistently <350 after 2 rescue attempts,  present to hospital for evaluation.   prenatal multivitamin Tabs tablet Take 1 tablet by mouth daily at 12 noon.       Diet: routine diet  Activity: Advance as tolerated. Pelvic rest for 6 weeks.   Outpatient follow up:4 weeks Follow up Appt: Future Appointments  Date Time Provider Department Center  04/16/2018  1:15 PM Reva Bores, MD [facility]   Follow up Visit:No follow-ups on file.  Postpartum contraception: IUD Mirena  Newborn Data: Live born female  Birth Weight: 7 lb 1.8 oz (3225 g) APGAR: 9, 9  Newborn Delivery   Birth date/time:  03/19/2018 06:12:00 Delivery type:  Vaginal, Spontaneous     Baby Feeding: Breast Disposition:home with mother   03/20/2018 Ted Mcalpine, MD

## 2018-03-20 NOTE — Lactation Note (Signed)
This note was copied from a baby's chart. Lactation Consultation Note  Patient Name: Melanie Beverly SessionsSarah Meadows UEAVW'UToday's Date: 03/20/2018 Reason for consult: Initial assessment;Follow-up assessment;1st time breastfeeding;Primapara;Early term 37-38.6wks  P1 mother whose infant is now 530 hours old.  This is an ETI at 38+6 weeks.  Mother stated that baby has done well on the right breast since birth but the left breast has been more difficult.  Upon observation, mother's left nipple is much shorter than the right one.  She had breast shells at bedside which I encouraged her to begin wearing immediately.  She put them in her bra and instructions for use provided.  She also had a manual pump at bedside which she is using to pre pump to help evert nipple.  Mother is familiar with hand expression and colostrum containers provided for any EBM she may obtain with pumping or hand expression.  She will finger feed/spoon feed any EBM she obtains back to baby.  Continue to feed 8-12 times/24 hours or sooner of baby shows feeding cues.  Engorgement prevention/treatment discussed.  Mother plans to order her DEBP online once she arrives home.  The insurance has been verified.  She has our OP phone number for questions/concerns after discharge.  Father present and supportive.  RN updated.   Maternal Data Formula Feeding for Exclusion: No Has patient been taught Hand Expression?: Yes Does the patient have breastfeeding experience prior to this delivery?: No  Feeding Feeding Type: Breast Fed  LATCH Score Latch: Grasps breast easily, tongue down, lips flanged, rhythmical sucking.  Audible Swallowing: A few with stimulation  Type of Nipple: Everted at rest and after stimulation  Comfort (Breast/Nipple): Filling, red/small blisters or bruises, mild/mod discomfort  Hold (Positioning): Assistance needed to correctly position infant at breast and maintain latch.  LATCH Score: 7  Interventions    Lactation  Tools Discussed/Used WIC Program: No   Consult Status Consult Status: Complete    Kamrin Sibley R Javell Blackburn 03/20/2018, 12:16 PM

## 2018-03-24 ENCOUNTER — Encounter: Payer: Managed Care, Other (non HMO) | Admitting: Family Medicine

## 2018-04-04 ENCOUNTER — Inpatient Hospital Stay (HOSPITAL_COMMUNITY): Payer: Managed Care, Other (non HMO)

## 2018-04-16 ENCOUNTER — Encounter: Payer: Self-pay | Admitting: Family Medicine

## 2018-04-16 ENCOUNTER — Ambulatory Visit (INDEPENDENT_AMBULATORY_CARE_PROVIDER_SITE_OTHER): Payer: Managed Care, Other (non HMO) | Admitting: Family Medicine

## 2018-04-16 DIAGNOSIS — Z30430 Encounter for insertion of intrauterine contraceptive device: Secondary | ICD-10-CM

## 2018-04-16 DIAGNOSIS — Z227 Latent tuberculosis: Secondary | ICD-10-CM

## 2018-04-16 DIAGNOSIS — Z3202 Encounter for pregnancy test, result negative: Secondary | ICD-10-CM | POA: Diagnosis not present

## 2018-04-16 DIAGNOSIS — Z30019 Encounter for initial prescription of contraceptives, unspecified: Secondary | ICD-10-CM

## 2018-04-16 LAB — POCT URINE PREGNANCY: Preg Test, Ur: NEGATIVE

## 2018-04-16 MED ORDER — LEVONORGESTREL 20 MCG/24HR IU IUD: 1.0000 | INTRAUTERINE_SYSTEM | Freq: Once | INTRAUTERINE | 0 refills | Status: DC

## 2018-04-16 MED ORDER — LEVONORGESTREL 20 MCG/24HR IU IUD
INTRAUTERINE_SYSTEM | Freq: Once | INTRAUTERINE | Status: AC
  Administered 2018-04-16: 14:00:00 via INTRAUTERINE

## 2018-04-16 NOTE — Patient Instructions (Signed)

## 2018-04-16 NOTE — Progress Notes (Signed)
Post Partum Exam  Melanie BeaverSarah Leanne Meadows is a 22 y.o. 512P1011 female who presents for a postpartum visit. She is four weeks postpartum following a vaginal delivery. I have fully reviewed the prenatal and intrapartum course. The delivery was at 38  gestational weeks.  Anesthesia: Epidural. Postpartum course has been uncomplicated . Baby's course has been complicated. Baby is feeding by breast. Bleeding not at this time. Bowel function is normal. Bladder function is normal. Patient  sexually active. Contraception method is IUD today. Postpartum depression screening:negative  I have independently verified the above information   Last pap smear done 09/10/2017 normal.  Review of Systems Pertinent items noted in HPI and remainder of comprehensive ROS otherwise negative.    Objective:  unknown if currently breastfeeding.  General:  alert, cooperative and appears stated age  Lungs: normal effort  Heart:  regular rate and rhythm  Abdomen: soft, non-tender; bowel sounds normal; no masses,  no organomegaly   Vulva:  normal  Vagina: normal vagina  Cervix:  no lesions  Procedure: Patient identified, informed consent performed, signed copy in chart, time out was performed.  Urine pregnancy test negative.  Speculum placed in the vagina.  Cervix visualized.  Cleaned with Betadine x 2.  Grasped anteriourly with a single tooth tenaculum.  Uterus sounded to 9 cm.  Mirena IUD placed per manufacturer's recommendations.  Strings trimmed to 3 cm.   Patient given post procedure instructions and Mirena care card with expiration date.   Assessment:    nml postpartum exam. Pap smear not done at today's visit.   Plan:   1. Contraception: IUD 2. Patient is asked to check IUD strings periodically and follow up in 4-6 weeks for IUD check. 3. Pap due 09/2019

## 2018-05-07 ENCOUNTER — Encounter: Payer: Self-pay | Admitting: *Deleted

## 2018-05-14 ENCOUNTER — Ambulatory Visit: Payer: Managed Care, Other (non HMO) | Admitting: Obstetrics and Gynecology

## 2018-05-20 ENCOUNTER — Ambulatory Visit (INDEPENDENT_AMBULATORY_CARE_PROVIDER_SITE_OTHER): Payer: Managed Care, Other (non HMO) | Admitting: Family Medicine

## 2018-05-20 VITALS — BP 122/82 | HR 85 | Wt 162.0 lb

## 2018-05-20 DIAGNOSIS — Z30431 Encounter for routine checking of intrauterine contraceptive device: Secondary | ICD-10-CM | POA: Diagnosis not present

## 2018-05-20 NOTE — Progress Notes (Signed)
   GYNECOLOGY CLINIC- IUD STRING CHECK PROGRESS NOTE  History:  23 y.o. [id number] here today for today for IUD string check; LNG-IUD was placed  04/16/18. No complaints about the Mirena, no concerning side effects. Patient is breastfeeding  The following portions of the patient's history were reviewed and updated as appropriate: allergies, current medications, past family history, past medical history, past social history, past surgical history and problem list. Last pap smear was normal- next in 2021  Review of Systems:  Pertinent items are noted in HPI.   Objective:  Physical Exam Blood pressure 122/82, pulse 85, weight 162 lb (73.5 kg), unknown if currently breastfeeding.  Gen: NAD Abd: Soft, nontender and nondistended Pelvic: Normal appearing external genitalia; normal appearing vaginal mucosa and cervix.  IUD strings visualized, about 3 cm in length outside cervix.   Assessment & Plan:  Normal IUD check. Patient to keep IUD in place for five years; can come in for removal if she desires pregnancy within the next five years. Routine preventative health maintenance measures emphasized  Federico FlakeKimberly Niles Baleigh Rennaker, MD  Faculty Practice  Center for [facility], [facility]

## 2018-08-06 ENCOUNTER — Other Ambulatory Visit: Payer: Self-pay

## 2018-08-06 ENCOUNTER — Ambulatory Visit: Payer: Managed Care, Other (non HMO) | Admitting: Clinical

## 2018-08-06 DIAGNOSIS — F4322 Adjustment disorder with anxiety: Secondary | ICD-10-CM

## 2018-08-06 NOTE — BH Specialist Note (Signed)
Integrated Behavioral Health Visit via Telemedicine (Telephone)  08/06/2018 Aubreyann Zermeno [facility] [id number]   Session Start time: 3:27  Session End time: 4:02 Total time: 30 minutes  Referring Provider:  Type of Visit: Telephonic Patient location: Home [facility] Provider location: [facility] All persons participating in visit: [facility] and Patient  Confirmed patient's address: Yes  Confirmed patient's phone number: Yes  Any changes to demographics: Yes   Confirmed patient's insurance: Yes  Any changes to patient's insurance: Yes   Discussed confidentiality: Yes    The following statements were read to the patient and/or legal guardian that are established with the [facility] Provider.  "The purpose of this phone visit is to provide behavioral health care while limiting exposure to the coronavirus (COVID19).  There is a possibility of technology failure and discussed alternative modes of communication if that failure occurs."  "By engaging in this telephone visit, you consent to the provision of healthcare.  Additionally, you authorize for your insurance to be billed for the services provided during this telephone visit."   Patient and/or legal guardian consented to telephone visit: Yes   PRESENTING CONCERNS: Patient and/or family reports the following symptoms/concerns: Pt states her primary concern is feelings of extreme anxiety and worry when she is away from baby that is causing sleep difficulties (will sleep up to 4 hours at a time), fatigue. 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 Pt open to learning self-coping strategies to gain control over emotions. No panic attacks. Duration of problem: Postpartum; Severity of problem: Moderately severe  STRENGTHS (Protective Factors/Coping Skills): Self-awareness; supportive family  GOALS ADDRESSED: Patient will: 1.  Reduce symptoms of: anxiety  2.  Increase knowledge and/or ability of: healthy habits and stress reduction  3.   Demonstrate ability to: Increase healthy adjustment to current life circumstances, Increase adequate support systems for patient/family and Increase motivation to adhere to plan of care  INTERVENTIONS: Interventions utilized:  Mindfulness or Management consultant, Psychoeducation and/or Health Education and Link to [employer] Standardized Assessments completed: not given today  ASSESSMENT: Patient currently experiencing Adjustment disorder with anxiety  Patient may benefit from psychoeducation and brief therapeutic interventions regarding coping with symptoms of anxiety .  PLAN: 1. Follow up with behavioral health clinician on : 08/10/2018 2. Behavioral recommendations:  -CALM relaxation breathing exercise at least twice daily (morning; evening); consider setting phone alarm for additional times throughout the day -Join online new mom support group at www.postpartum.net  - 3. Referral(s): Integrated Art gallery manager (In Clinic) and [employer]:  new mom support  Rae Lips

## 2018-08-07 ENCOUNTER — Other Ambulatory Visit: Payer: Self-pay | Admitting: Obstetrics & Gynecology

## 2018-08-07 DIAGNOSIS — F419 Anxiety disorder, unspecified: Secondary | ICD-10-CM | POA: Insufficient documentation

## 2018-08-07 MED ORDER — HYDROXYZINE HCL 25 MG PO TABS: 25.0000 mg | ORAL_TABLET | Freq: Four times a day (QID) | ORAL | 2 refills | Status: DC | PRN

## 2018-08-07 MED ORDER — BUSPIRONE HCL 10 MG PO TABS: 10.0000 mg | ORAL_TABLET | Freq: Two times a day (BID) | ORAL | 2 refills | Status: DC

## 2018-08-07 NOTE — Progress Notes (Signed)
Vistaril and Buspar prescribed for anxiety for this patient.  She was called at home and advised to pick up prescriptions. Advised to call back for any worsening symptoms.   Jaynie Collins, MD, FACOG Obstetrician & Gynecologist, [facility] for [employer], [facility]

## 2018-08-10 ENCOUNTER — Telehealth: Payer: Self-pay | Admitting: Clinical

## 2018-08-10 NOTE — Telephone Encounter (Signed)
Integrated Behavioral Health Medication Management Phone Note  MRN: [id number] NAME: Melanie Meadows [facility]  Time Call Initiated: 1:09 Time Call Completed: 1:12 Total Call Time: 15 minutes  Current Medications:  Outpatient Medications Prior to Visit  Medication Sig Dispense Refill  . acetaminophen (TYLENOL) 500 MG tablet Take 1,000 mg by mouth every 6 (six) hours as needed for mild pain or headache.    . albuterol (PROVENTIL HFA;VENTOLIN HFA) 108 (90 Base) MCG/ACT inhaler Inhale 2 puffs into the lungs every 6 (six) hours as needed for wheezing or shortness of breath. (Patient not taking: Reported on 05/20/2018) 1 Inhaler 2  . busPIRone (BUSPAR) 10 MG tablet Take 1 tablet (10 mg total) by mouth 2 (two) times daily. 60 tablet 2  . calcium carbonate (TUMS - DOSED IN MG ELEMENTAL CALCIUM) 500 MG chewable tablet Chew 1 tablet by mouth as needed for indigestion or heartburn. Up to 6 times daily as needed.    . ferrous sulfate 325 (65 FE) MG tablet Take 1 tablet (325 mg total) by mouth 2 (two) times daily with a meal. 60 tablet 3  . hydrOXYzine (ATARAX/VISTARIL) 25 MG tablet Take 1-2 tablets (25-50 mg total) by mouth every 6 (six) hours as needed for anxiety. 30 tablet 2  . levonorgestrel (MIRENA) 20 MCG/24HR IUD 1 Intra Uterine Device (1 each total) by Intrauterine route once for 1 dose. 1 each 0  . magnesium oxide (MAGOX 400) 400 (241.3 Mg) MG tablet One pill by mouth as needed for headaches. (Patient not taking: Reported on 05/20/2018)    . Misc. Devices (BREAST PUMP) MISC Dispense one breast pump for patient (Patient not taking: Reported on 05/20/2018) 1 each 0  . Peak Flow Meter DEVI Measure peak flow daily or if short of breath.  If <350cc (which is 80% of your expected), take 2 puffs of albuterol rescue inhaler and reassess in 20 mins.  If persistently <350 after 2 rescue attempts, present to hospital for evaluation. (Patient not taking: Reported on 05/20/2018) 1 each 0  . Prenatal Vit-Fe Fumarate-FA  (PRENATAL MULTIVITAMIN) TABS tablet Take 1 tablet by mouth daily at 12 noon.     No facility-administered medications prior to visit.     Patient has been able to get all medications filled as prescribed: Yes  Patient is currently taking all medications as prescribed: Yes  Patient reports experiencing side effects: Yes- "a little foggy" at times, but only briefly  Patient describes feeling this way on medications: Pt says she can feel a difference already; is helping to reduce anxiety  Additional patient concerns: No other concerns at this time. Pt requests a call-back in one month, when "coronavirus goes down" to reassess symptoms at that time  Patient advised to schedule appointment with provider for evaluation of medication side effects or additional concerns: Yes- Pt agrees to a telephone follow up on Monday, May 4 at 1:00pm   Rae Lips, LCSW

## 2018-08-29 ENCOUNTER — Other Ambulatory Visit: Payer: Self-pay | Admitting: Obstetrics & Gynecology

## 2018-08-29 DIAGNOSIS — F419 Anxiety disorder, unspecified: Secondary | ICD-10-CM

## 2018-09-07 ENCOUNTER — Ambulatory Visit: Payer: Managed Care, Other (non HMO) | Admitting: Clinical

## 2018-09-07 NOTE — BH Specialist Note (Deleted)
Attempt to complete Webex video visit; Pt did not arrive to Webex visit, and did not answer the phone. No phone message left, as voicemail is full. MyChart message left for patient.   Integrated Behavioral Health Follow Up Visit  MRN: [id number] Name: Melanie Meadows [facility]   Rae Lips, LCSW

## 2019-05-12 IMAGING — US US RENAL
1 series · 15 of 25 positions shown · non-contrast
Comparison: None.

CLINICAL DATA: Right flank pain. Nausea. Patient is 21 weeks
pregnant.

EXAM:
RENAL / URINARY TRACT ULTRASOUND COMPLETE

[Series 1: us renal · 15 of 42 slices shown]
[im 1/42]
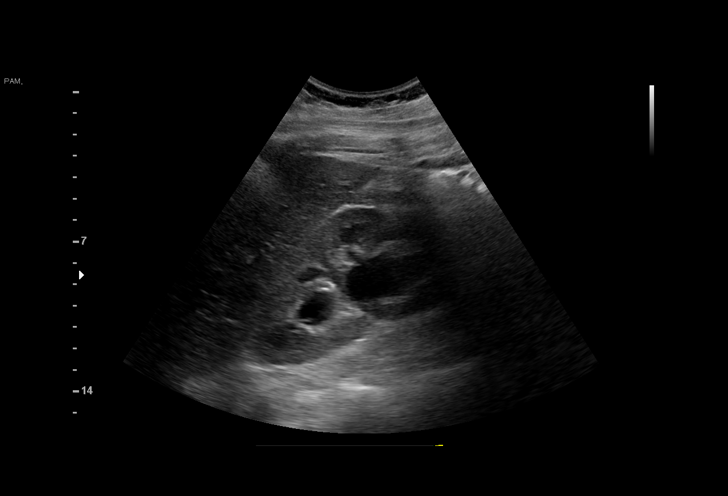
[im 4/42]
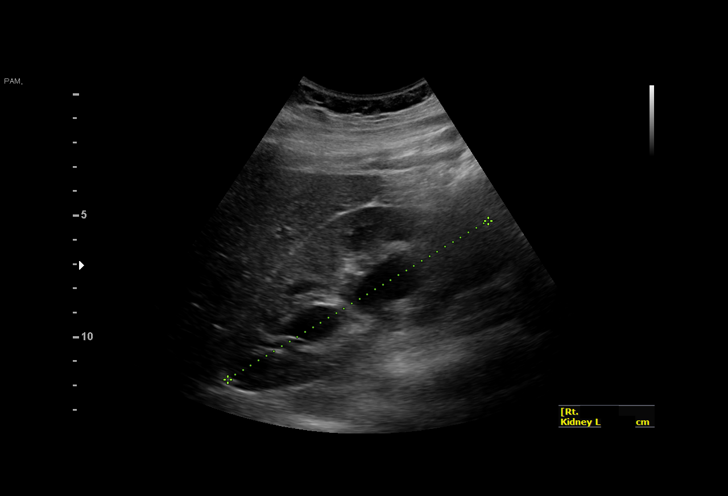
[im 7/42]
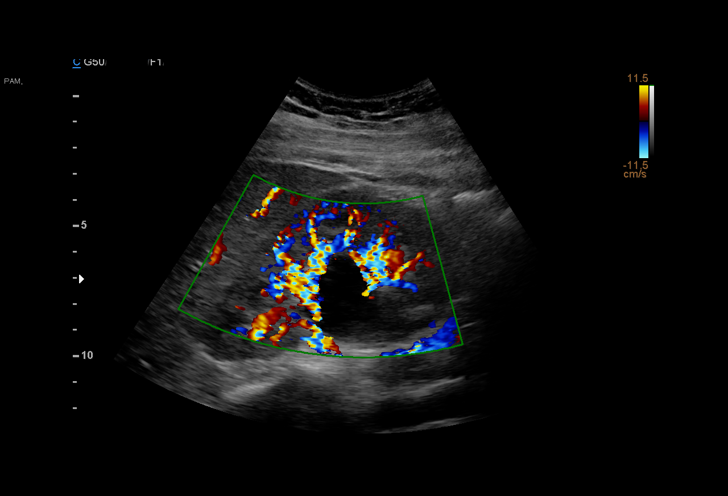
[im 9/42]
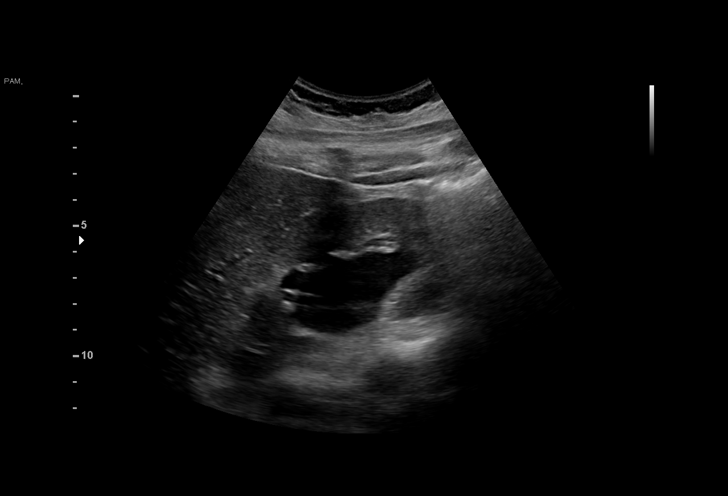
[im 12/42]
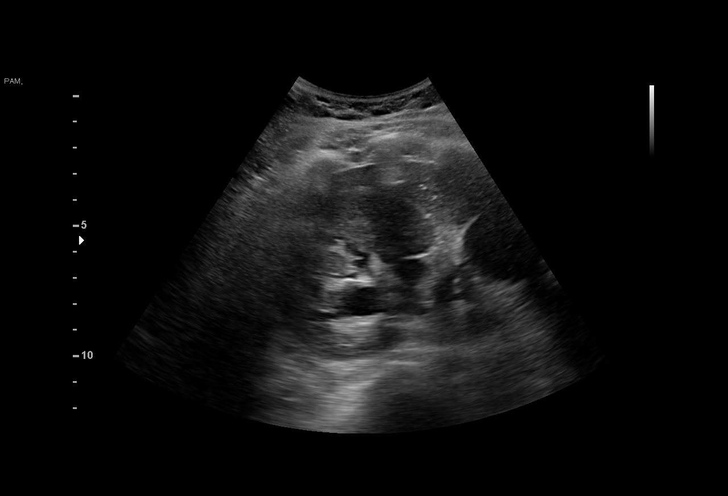
[im 16/42]
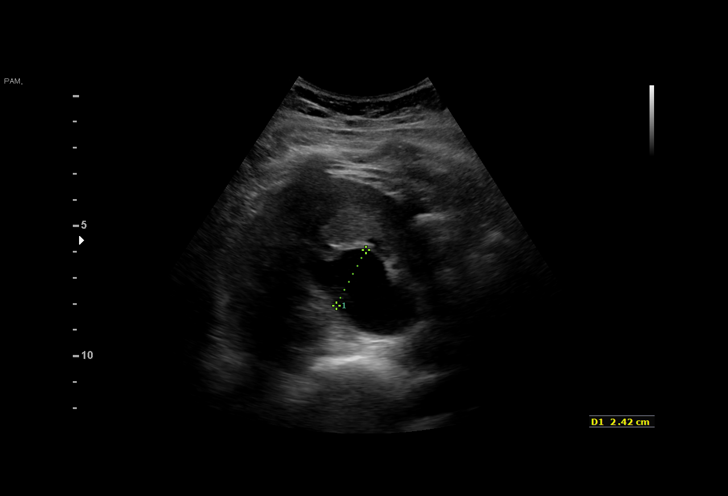
[im 18/42]
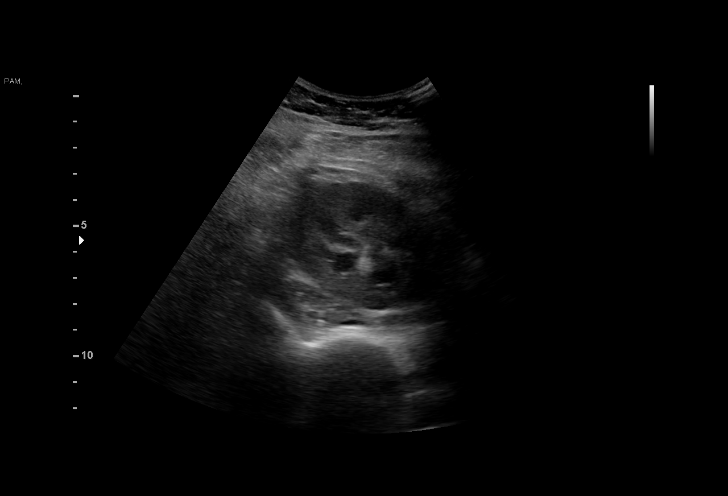
[im 21/42]
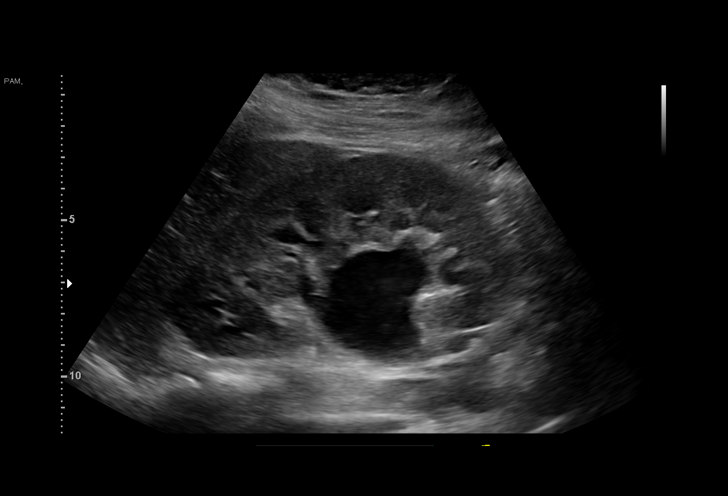
[im 24/42]
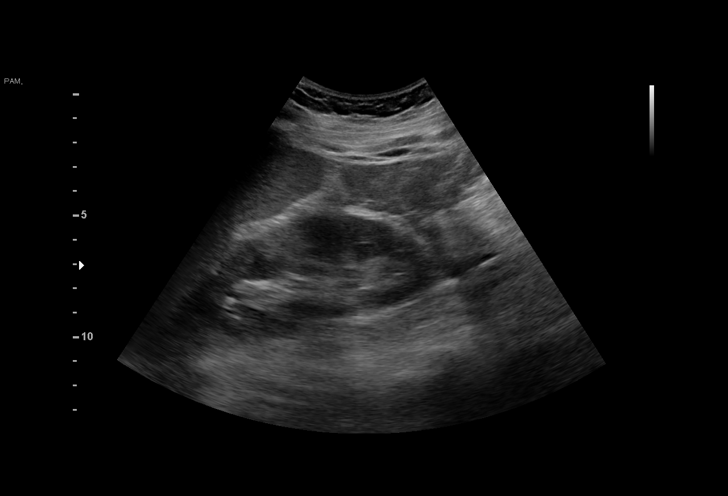
[im 26/42]
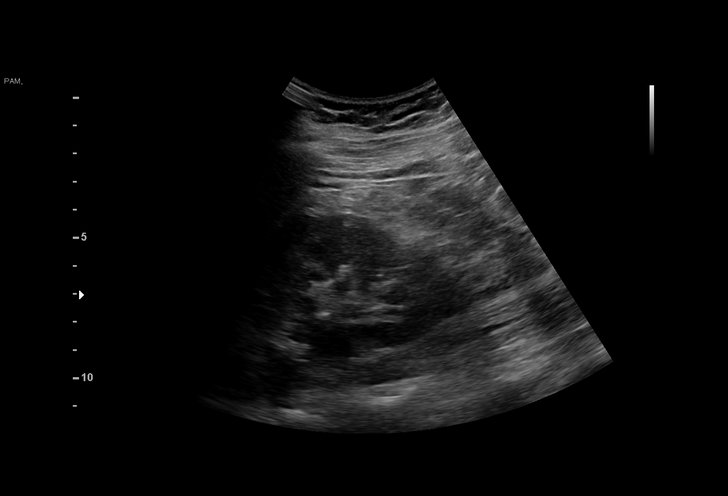
[im 30/42]
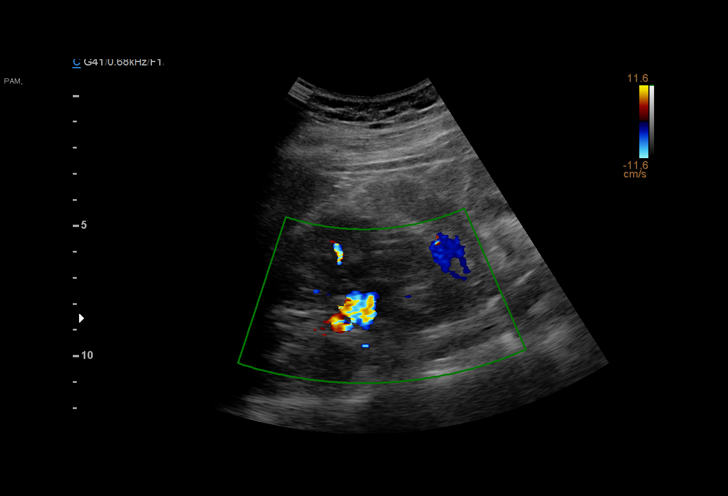
[im 33/42]
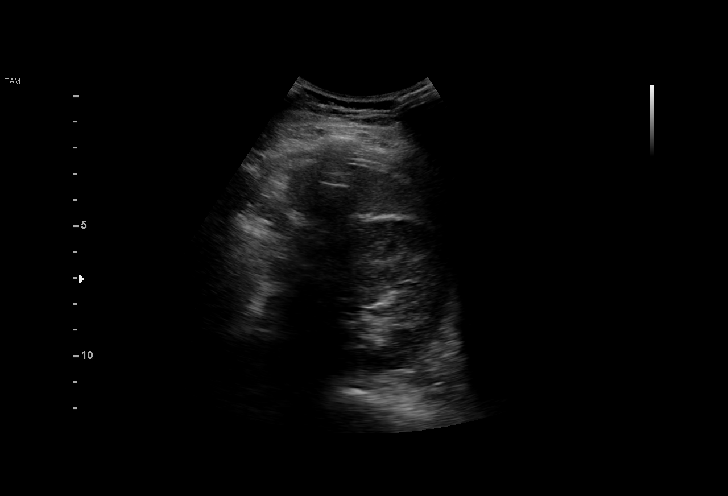
[im 35/42]
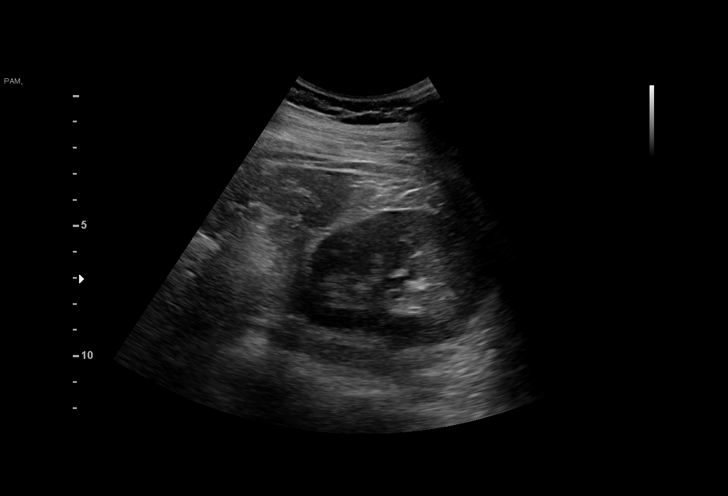
[im 38/42]
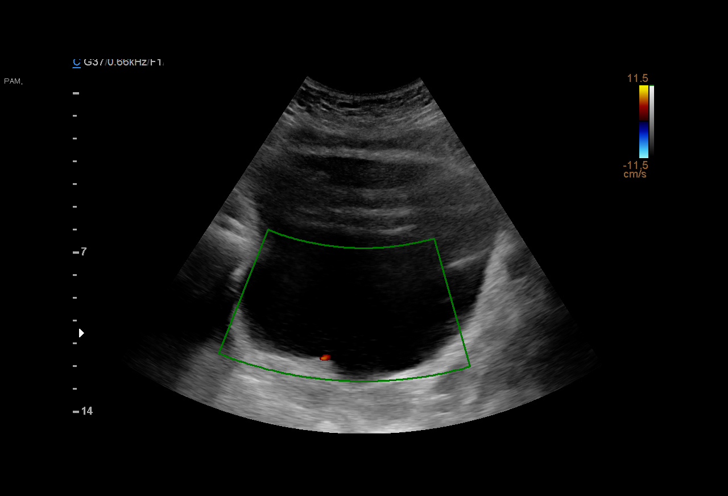
[im 42/42]
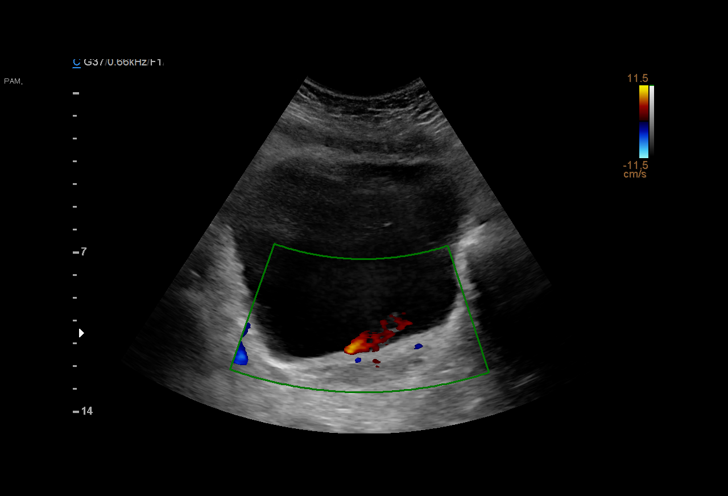

[15 of 25 positions shown; findings below may reference images not displayed]

FINDINGS: Right Kidney:

Length: 12.6 cm. Moderate right hydronephrosis. Normal parenchymal
echogenicity. No mass or stone.

Left Kidney:

Length: 11.5 cm. Echogenicity within normal limits. No mass or
hydronephrosis visualized.

Bladder:

Appears normal for degree of bladder distention. Bilateral ureteral
jets noted.
IMPRESSION: 1. Moderate right hydronephrosis. This is suspicious for an
obstructing ureteral stone given the symptoms of right flank pain.
No stone visualized.

## 2021-02-15 ENCOUNTER — Encounter: Payer: Self-pay | Admitting: Infectious Disease

## 2021-02-15 ENCOUNTER — Ambulatory Visit (INDEPENDENT_AMBULATORY_CARE_PROVIDER_SITE_OTHER): Payer: 59 | Admitting: Infectious Disease

## 2021-02-15 ENCOUNTER — Other Ambulatory Visit: Payer: Self-pay

## 2021-02-15 VITALS — BP 104/67 | HR 96 | Temp 97.5°F | Ht 66.0 in | Wt 147.0 lb

## 2021-02-15 DIAGNOSIS — Z227 Latent tuberculosis: Secondary | ICD-10-CM

## 2021-02-15 DIAGNOSIS — F419 Anxiety disorder, unspecified: Secondary | ICD-10-CM | POA: Diagnosis not present

## 2021-02-15 DIAGNOSIS — Z7185 Encounter for immunization safety counseling: Secondary | ICD-10-CM | POA: Diagnosis not present

## 2021-02-15 DIAGNOSIS — J4520 Mild intermittent asthma, uncomplicated: Secondary | ICD-10-CM

## 2021-02-15 DIAGNOSIS — U071 COVID-19: Secondary | ICD-10-CM

## 2021-02-15 DIAGNOSIS — R635 Abnormal weight gain: Secondary | ICD-10-CM

## 2021-02-15 DIAGNOSIS — R634 Abnormal weight loss: Secondary | ICD-10-CM

## 2021-02-15 DIAGNOSIS — R0789 Other chest pain: Secondary | ICD-10-CM

## 2021-02-15 DIAGNOSIS — R5383 Other fatigue: Secondary | ICD-10-CM

## 2021-02-15 DIAGNOSIS — R0600 Dyspnea, unspecified: Secondary | ICD-10-CM

## 2021-02-15 NOTE — Progress Notes (Signed)
Subjective:  Reason for infectious disease consult: Latent tuberculosis  Requesting physician: Adela Glimpse, NP   Patient ID: Melanie Meadows, female    DOB: [birth year], 25 y.o.   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  During that time.  She had direct contact with a known case of active tuberculosis.  She subsequently tested positive by skin test PPD and QuantiFERON gold.  In 2019 she initiated rifampin but had become pregnant and discontinued it.  She was followed by Dr. Shawnie Pons during her pregnancy and she did test negative for HIV during pregnancy.  She was also tested for other STIs but this was part of her routine care not because there was concern about risk for STIs.  Since then she has not yet been treated for latent tuberculosis.  She had COVID-19 infection twice once in 2020 and once in 2021.  She has not been vaccinated against COVID-19 she did lose nearly 50 pounds of weight in the context of having a baby and also being quite stressed out during the COVID-19 pandemic when she worked as a Radiation protection practitioner.  She now works as a Customer service manager and is in better spirits and less anxious.  She still lives in [place] which makes a bit of a drive to come to our clinic.  She had a chest x-ray in August 2022 which was clear without evidence of any infection in the lungs.  She had been feeling profoundly fatigued also with symptoms of difficulty breathing and shortness of breath with exertion also with some dizziness spells and feeling hot.  She had episodes of diaphoresis with some of these episodes of dyspnea.  The onset was fairly abrupt and she actually sought care at [facility] where she was briefly admitted and had work-up until including PCR for COVID-19 which was negative.  She did say that her  symptoms were very similar to one of the occasions that she did have COVID-19 infection.  Though symptoms however now have abated.  She currently does not have fevers chills nausea or other systemic infectious symptoms she has no cough.  She lost weight during the pandemic after she had had her child but she attributes much of this to stress and she is also regained the 50 pounds she lost back.     Past Medical History:  Diagnosis Date   Anemia    Asthma    Bartholin's cyst 09/10/2017   Not-infected; have MD re-evaluate at next OB appt   Family history of ovarian cancer    PT CANDIDATE FOR GENETIC TESTING AT AGE 35   Headache    Immunization, viral disease    GARDASIL COMPLETED   Infection    UTI   Kidney stone    Latent tuberculosis    Ovarian cyst 05/2012   RUPTURED; SAW URGENT CARE   Tuberculosis    latent tb     Past Surgical History:  Procedure Laterality Date   NO PAST SURGERIES      Family History  Problem Relation Age of Onset   Cancer Paternal Grandmother       Social History   Socioeconomic History   Marital status: Married    Spouse name: Not on file   Number of children: Not on file   Years of education: Not on  file   Highest education level: Not on file  Occupational History   Not on file  Tobacco Use   Smoking status: Never   Smokeless tobacco: Never  Vaping Use   Vaping Use: Never used  Substance and Sexual Activity   Alcohol use: No   Drug use: No   Sexual activity: Yes    Partners: Male    Comment: pregnant  Other Topics Concern   Not on file  Social History Narrative   Not on file   Social Determinants of Health   Financial Resource Strain: Not on file  Food Insecurity: Not on file  Transportation Needs: Not on file  Physical Activity: Not on file  Stress: Not on file  Social Connections: Not on file    Allergies  Allergen Reactions   Pork-Derived Products Nausea Only, Swelling and Rash     Current Outpatient  Medications:    acetaminophen (TYLENOL) 500 MG tablet, Take 1,000 mg by mouth every 6 (six) hours as needed for mild pain or headache., Disp: , Rfl:    Misc. Devices (BREAST PUMP) MISC, Dispense one breast pump for patient, Disp: 1 each, Rfl: 0   propranolol (INDERAL) 10 MG tablet, Take 10 mg by mouth daily., Disp: , Rfl:    albuterol (PROVENTIL HFA;VENTOLIN HFA) 108 (90 Base) MCG/ACT inhaler, Inhale 2 puffs into the lungs every 6 (six) hours as needed for wheezing or shortness of breath. (Patient not taking: Reported on 05/20/2018), Disp: 1 Inhaler, Rfl: 2   busPIRone (BUSPAR) 10 MG tablet, TAKE 1 TABLET BY MOUTH TWICE A DAY, Disp: 180 tablet, Rfl: 1   calcium carbonate (TUMS - DOSED IN MG ELEMENTAL CALCIUM) 500 MG chewable tablet, Chew 1 tablet by mouth as needed for indigestion or heartburn. Up to 6 times daily as needed., Disp: , Rfl:    ferrous sulfate 325 (65 FE) MG tablet, Take 1 tablet (325 mg total) by mouth 2 (two) times daily with a meal., Disp: 60 tablet, Rfl: 3   hydrOXYzine (ATARAX/VISTARIL) 25 MG tablet, Take 1-2 tablets (25-50 mg total) by mouth every 6 (six) hours as needed for anxiety., Disp: 30 tablet, Rfl: 2   levonorgestrel (MIRENA) 20 MCG/24HR IUD, 1 Intra Uterine Device (1 each total) by Intrauterine route once for 1 dose., Disp: 1 each, Rfl: 0   magnesium oxide (MAGOX 400) 400 (241.3 Mg) MG tablet, One pill by mouth as needed for headaches. (Patient not taking: Reported on 05/20/2018), Disp: , Rfl:    Peak Flow Meter DEVI, Measure peak flow daily or if short of breath.  If <350cc (which is 80% of your expected), take 2 puffs of albuterol rescue inhaler and reassess in 20 mins.  If persistently <350 after 2 rescue attempts, present to hospital for evaluation. (Patient not taking: Reported on 05/20/2018), Disp: 1 each, Rfl: 0   Prenatal Vit-Fe Fumarate-FA (PRENATAL MULTIVITAMIN) TABS tablet, Take 1 tablet by mouth daily at 12 noon., Disp: , Rfl:     Review of Systems   Constitutional:  Negative for activity change, appetite change, chills, diaphoresis, fatigue, fever and unexpected weight change.  HENT:  Negative for congestion, rhinorrhea, sinus pressure, sneezing, sore throat and trouble swallowing.   Eyes:  Negative for photophobia and visual disturbance.  Respiratory:  Negative for cough, chest tightness, shortness of breath, wheezing and stridor.   Cardiovascular:  Negative for chest pain, palpitations and leg swelling.  Gastrointestinal:  Negative for abdominal distention, abdominal pain, anal bleeding, blood in stool, constipation, diarrhea, nausea  and vomiting.  Genitourinary:  Negative for difficulty urinating, dysuria, flank pain and hematuria.  Musculoskeletal:  Negative for arthralgias, back pain, gait problem, joint swelling and myalgias.  Skin:  Negative for color change, pallor, rash and wound.  Neurological:  Negative for dizziness, tremors, weakness and light-headedness.  Hematological:  Negative for adenopathy. Does not bruise/bleed easily.  Psychiatric/Behavioral:  Negative for agitation, behavioral problems, confusion, decreased concentration, dysphoric mood and sleep disturbance.       Objective:   Physical Exam Constitutional:      General: She is not in acute distress.    Appearance: Normal appearance. She is well-developed. She is not ill-appearing or diaphoretic.  HENT:     Head: Normocephalic and atraumatic.     Right Ear: Hearing and external ear normal.     Left Ear: Hearing and external ear normal.     Nose: No nasal deformity or rhinorrhea.  Eyes:     General: No scleral icterus.    Conjunctiva/sclera: Conjunctivae normal.     Right eye: Right conjunctiva is not injected.     Left eye: Left conjunctiva is not injected.     Pupils: Pupils are equal, round, and reactive to light.  Neck:     Vascular: No JVD.  Cardiovascular:     Rate and Rhythm: Normal rate and regular rhythm.     Heart sounds: Normal heart sounds, S1  normal and S2 normal. No murmur heard.   No friction rub. No gallop.  Pulmonary:     Effort: No respiratory distress.     Breath sounds: No stridor. No wheezing or rhonchi.  Abdominal:     General: Bowel sounds are normal. There is no distension.     Palpations: Abdomen is soft.     Tenderness: There is no abdominal tenderness.  Musculoskeletal:        General: Normal range of motion.     Right shoulder: Normal.     Left shoulder: Normal.     Cervical back: Normal range of motion and neck supple.     Right hip: Normal.     Left hip: Normal.     Right knee: Normal.     Left knee: Normal.  Lymphadenopathy:     Head:     Right side of head: No submandibular, preauricular or posterior auricular adenopathy.     Left side of head: No submandibular, preauricular or posterior auricular adenopathy.     Cervical: No cervical adenopathy.     Right cervical: No superficial or deep cervical adenopathy.    Left cervical: No superficial or deep cervical adenopathy.  Skin:    General: Skin is warm and dry.     Coloration: Skin is not pale.     Findings: No abrasion, bruising, ecchymosis, erythema, lesion or rash.     Nails: There is no clubbing.  Neurological:     General: No focal deficit present.     Mental Status: She is alert and oriented to person, place, and time.     Sensory: No sensory deficit.     Coordination: Coordination normal.     Gait: Gait normal.  Psychiatric:        Attention and Perception: She is attentive.        Mood and Affect: Mood normal.        Speech: Speech normal.        Behavior: Behavior normal. Behavior is cooperative.        Thought Content: Thought content normal.  Judgment: Judgment normal.          Assessment & Plan:   Latent tuberculosis:  We will check her for HIV and as long as she is HIV seronegative initiate treatment for LTBI with isoniazid x9 months with vitamin B6  We will see her back in 2 months time.  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  Asthma: Not currently active.    I spent 64 minutes with the patient including than 50% of the time in face to face counseling of the patient during the nature of latent tuberculosis and the different ways we have to treat it, personally reviewing her chest x-ray from August 2022 that showed no infiltrates reviewing labs from primary care physician including comprehensive metabolic panel CBC with differential QuantiFERON gold vitamin D levels along with along with review of medical records in preparation for the visit and during the visit and in coordination of her care.

## 2021-02-16 ENCOUNTER — Telehealth: Payer: Self-pay | Admitting: Infectious Disease

## 2021-02-16 ENCOUNTER — Other Ambulatory Visit: Payer: Self-pay

## 2021-02-16 ENCOUNTER — Ambulatory Visit: Payer: Managed Care, Other (non HMO) | Admitting: Internal Medicine

## 2021-02-16 DIAGNOSIS — Z227 Latent tuberculosis: Secondary | ICD-10-CM

## 2021-02-16 LAB — HIV ANTIBODY (ROUTINE TESTING W REFLEX): HIV 1&2 Ab, 4th Generation: NONREACTIVE

## 2021-02-16 MED ORDER — ISONIAZID 300 MG PO TABS: 300.0000 mg | ORAL_TABLET | Freq: Every day | ORAL | 8 refills | Status: DC

## 2021-02-16 MED ORDER — VITAMIN B-6 50 MG PO TABS: 50.0000 mg | ORAL_TABLET | Freq: Every day | ORAL | 8 refills | Status: DC

## 2021-02-16 NOTE — Telephone Encounter (Signed)
Patient was HIV negative  I have sent rx for INH and B6 to pharmacy in [place], can we make sure that is where she wants it sent?

## 2021-02-16 NOTE — Telephone Encounter (Signed)
Patient notified of test results. Patient requested a pharmacy change; RN reordered INH + B6 to preferred pharmacy.  Kaiyden Simkin Loyola Mast, RN

## 2021-03-20 ENCOUNTER — Ambulatory Visit: Payer: 59 | Admitting: Infectious Disease

## 2021-03-20 ENCOUNTER — Telehealth: Payer: Self-pay

## 2021-03-20 ENCOUNTER — Other Ambulatory Visit: Payer: Self-pay

## 2021-03-20 ENCOUNTER — Other Ambulatory Visit (HOSPITAL_COMMUNITY): Payer: Self-pay

## 2021-03-20 DIAGNOSIS — Z227 Latent tuberculosis: Secondary | ICD-10-CM

## 2021-03-20 MED ORDER — VITAMIN B-6 50 MG PO TABS
50.0000 mg | ORAL_TABLET | Freq: Every day | ORAL | 8 refills | Status: DC
  Filled 2021-03-20: qty 100, 100d supply, fill #0

## 2021-03-20 MED ORDER — ISONIAZID 300 MG PO TABS
300.0000 mg | ORAL_TABLET | Freq: Every day | ORAL | 8 refills | Status: DC
  Filled 2021-03-20: qty 30, 30d supply, fill #0

## 2021-03-20 NOTE — Telephone Encounter (Signed)
Called patient back to see what other pharmacy she would like to try, no answer and voicemail full.   Sandie Ano, RN

## 2021-03-20 NOTE — Telephone Encounter (Signed)
-----   Message from [place] sent at 03/20/2021  8:44 AM EST ----- Patient called in regards to [facility]) and stated she hasn't been  able to pick it up because the pharmacy doesn't have it in stock so she wanted to cancel her appointment and wanted to know if there was another pharmacy that it could possibly be sent to, so she can start it.

## 2021-03-20 NOTE — Telephone Encounter (Signed)
Isoniazid and B6 prescription sent to [facility] long outpatient pharmacy. Instructed her to call them this afternoon to set up delivery if possible.   Sedrick Tober Loyola Mast, RN

## 2021-03-21 ENCOUNTER — Other Ambulatory Visit (HOSPITAL_COMMUNITY): Payer: Self-pay

## 2021-03-22 ENCOUNTER — Ambulatory Visit: Payer: 59 | Admitting: Infectious Disease

## 2021-11-19 ENCOUNTER — Ambulatory Visit: Payer: 59

## 2022-02-25 ENCOUNTER — Other Ambulatory Visit (HOSPITAL_BASED_OUTPATIENT_CLINIC_OR_DEPARTMENT_OTHER): Payer: Self-pay

## 2022-03-22 ENCOUNTER — Encounter (HOSPITAL_BASED_OUTPATIENT_CLINIC_OR_DEPARTMENT_OTHER): Payer: Self-pay | Admitting: Emergency Medicine

## 2022-03-22 ENCOUNTER — Observation Stay (HOSPITAL_BASED_OUTPATIENT_CLINIC_OR_DEPARTMENT_OTHER)
Admission: EM | Admit: 2022-03-22 | Discharge: 2022-03-23 | Disposition: A | Discharge status: Home / Self Care | Payer: Commercial Managed Care - HMO | Attending: Internal Medicine | Admitting: Internal Medicine

## 2022-03-22 ENCOUNTER — Emergency Department (HOSPITAL_BASED_OUTPATIENT_CLINIC_OR_DEPARTMENT_OTHER): Payer: Commercial Managed Care - HMO

## 2022-03-22 ENCOUNTER — Other Ambulatory Visit: Payer: Self-pay

## 2022-03-22 DIAGNOSIS — J209 Acute bronchitis, unspecified: Secondary | ICD-10-CM | POA: Insufficient documentation

## 2022-03-22 DIAGNOSIS — J45901 Unspecified asthma with (acute) exacerbation: Secondary | ICD-10-CM | POA: Diagnosis not present

## 2022-03-22 DIAGNOSIS — R062 Wheezing: Secondary | ICD-10-CM | POA: Diagnosis not present

## 2022-03-22 DIAGNOSIS — Z3A23 23 weeks gestation of pregnancy: Secondary | ICD-10-CM | POA: Diagnosis not present

## 2022-03-22 DIAGNOSIS — Z8611 Personal history of tuberculosis: Secondary | ICD-10-CM | POA: Diagnosis not present

## 2022-03-22 DIAGNOSIS — J45909 Unspecified asthma, uncomplicated: Secondary | ICD-10-CM | POA: Diagnosis present

## 2022-03-22 DIAGNOSIS — O99891 Other specified diseases and conditions complicating pregnancy: Secondary | ICD-10-CM | POA: Insufficient documentation

## 2022-03-22 DIAGNOSIS — Z1152 Encounter for screening for COVID-19: Secondary | ICD-10-CM | POA: Insufficient documentation

## 2022-03-22 DIAGNOSIS — R002 Palpitations: Secondary | ICD-10-CM | POA: Diagnosis present

## 2022-03-22 DIAGNOSIS — Z3490 Encounter for supervision of normal pregnancy, unspecified, unspecified trimester: Secondary | ICD-10-CM | POA: Insufficient documentation

## 2022-03-22 DIAGNOSIS — O99512 Diseases of the respiratory system complicating pregnancy, second trimester: Secondary | ICD-10-CM | POA: Diagnosis not present

## 2022-03-22 DIAGNOSIS — O368120 Decreased fetal movements, second trimester, not applicable or unspecified: Secondary | ICD-10-CM | POA: Diagnosis present

## 2022-03-22 DIAGNOSIS — Z79899 Other long term (current) drug therapy: Secondary | ICD-10-CM | POA: Diagnosis not present

## 2022-03-22 DIAGNOSIS — F419 Anxiety disorder, unspecified: Secondary | ICD-10-CM | POA: Diagnosis present

## 2022-03-22 DIAGNOSIS — Z227 Latent tuberculosis: Secondary | ICD-10-CM | POA: Diagnosis present

## 2022-03-22 LAB — BASIC METABOLIC PANEL WITH GFR
Anion gap: 7 (ref 5–15)
BUN: <5 mg/dL — ABNORMAL LOW (ref 6–20)
CO2: 21 mmol/L — ABNORMAL LOW (ref 22–32)
Calcium: 8.1 mg/dL — ABNORMAL LOW (ref 8.9–10.3)
Chloride: 106 mmol/L (ref 98–111)
Creatinine, Ser: 0.51 mg/dL (ref 0.44–1.00)
GFR, Estimated: >60 mL/min
Glucose, Bld: 97 mg/dL (ref 70–99)
Potassium: 3.9 mmol/L (ref 3.5–5.1)
Sodium: 134 mmol/L — ABNORMAL LOW (ref 135–145)

## 2022-03-22 LAB — CBC WITH DIFFERENTIAL/PLATELET
Abs Immature Granulocytes: 0.08 K/uL — ABNORMAL HIGH (ref 0.00–0.07)
Basophils Absolute: 0.1 K/uL (ref 0.0–0.1)
Basophils Relative: 1 %
Eosinophils Absolute: 0.6 K/uL — ABNORMAL HIGH (ref 0.0–0.5)
Eosinophils Relative: 3 %
HCT: 36.1 % (ref 36.0–46.0)
Hemoglobin: 12.4 g/dL (ref 12.0–15.0)
Immature Granulocytes: 1 %
Lymphocytes Relative: 10 %
Lymphs Abs: 1.7 K/uL (ref 0.7–4.0)
MCH: 30.4 pg (ref 26.0–34.0)
MCHC: 34.3 g/dL (ref 30.0–36.0)
MCV: 88.5 fL (ref 80.0–100.0)
Monocytes Absolute: 0.9 K/uL (ref 0.1–1.0)
Monocytes Relative: 5 %
Neutro Abs: 13.6 K/uL — ABNORMAL HIGH (ref 1.7–7.7)
Neutrophils Relative %: 80 %
Platelets: 304 K/uL (ref 150–400)
RBC: 4.08 MIL/uL (ref 3.87–5.11)
RDW: 13.8 % (ref 11.5–15.5)
WBC: 16.9 K/uL — ABNORMAL HIGH (ref 4.0–10.5)
nRBC: 0 % (ref 0.0–0.2)

## 2022-03-22 LAB — SARS CORONAVIRUS 2 BY RT PCR: SARS Coronavirus 2 by RT PCR: NEGATIVE

## 2022-03-22 LAB — MAGNESIUM: Magnesium: 1.9 mg/dL (ref 1.7–2.4)

## 2022-03-22 MED ORDER — ALBUTEROL SULFATE HFA 108 (90 BASE) MCG/ACT IN AERS: 2.0000 | INHALATION_SPRAY | Freq: Four times a day (QID) | RESPIRATORY_TRACT | 2 refills | Status: DC | PRN

## 2022-03-22 MED ORDER — PROPRANOLOL HCL 10 MG PO TABS: 10.0000 mg | ORAL_TABLET | Freq: Every day | ORAL | Status: DC

## 2022-03-22 MED ORDER — CALCIUM CARBONATE ANTACID 500 MG PO CHEW: 1.0000 | CHEWABLE_TABLET | ORAL | Status: DC | PRN

## 2022-03-22 MED ORDER — IPRATROPIUM-ALBUTEROL 0.5-2.5 (3) MG/3ML IN SOLN
3.0000 mL | RESPIRATORY_TRACT | Status: DC
  Administered 2022-03-22 (×2): 3 mL via RESPIRATORY_TRACT
  Filled 2022-03-22: qty 3
  Filled 2022-03-22: qty 6

## 2022-03-22 MED ORDER — BUSPIRONE HCL 10 MG PO TABS: 10.0000 mg | ORAL_TABLET | Freq: Two times a day (BID) | ORAL | Status: DC

## 2022-03-22 MED ORDER — FERROUS SULFATE 325 (65 FE) MG PO TABS
325.0000 mg | ORAL_TABLET | Freq: Two times a day (BID) | ORAL | Status: DC
  Administered 2022-03-22: 325 mg via ORAL
  Filled 2022-03-22: qty 1

## 2022-03-22 MED ORDER — PREDNISONE 50 MG PO TABS
60.0000 mg | ORAL_TABLET | Freq: Once | ORAL | Status: AC
  Administered 2022-03-22: 60 mg via ORAL
  Filled 2022-03-22: qty 1

## 2022-03-22 MED ORDER — IPRATROPIUM-ALBUTEROL 0.5-2.5 (3) MG/3ML IN SOLN
3.0000 mL | Freq: Once | RESPIRATORY_TRACT | Status: AC
  Administered 2022-03-22: 3 mL via RESPIRATORY_TRACT
  Filled 2022-03-22: qty 3

## 2022-03-22 MED ORDER — PREDNISONE 20 MG PO TABS: 40.0000 mg | ORAL_TABLET | Freq: Every day | ORAL | 0 refills | Status: DC

## 2022-03-22 MED ORDER — PYRIDOXINE HCL 25 MG PO TABS
50.0000 mg | ORAL_TABLET | Freq: Every day | ORAL | Status: DC
  Filled 2022-03-22: qty 2

## 2022-03-22 MED ORDER — PRENATAL MULTIVITAMIN CH
1.0000 | ORAL_TABLET | Freq: Every day | ORAL | Status: DC
  Filled 2022-03-22: qty 1

## 2022-03-22 MED ORDER — IPRATROPIUM-ALBUTEROL 0.5-2.5 (3) MG/3ML IN SOLN
3.0000 mL | RESPIRATORY_TRACT | Status: DC
  Administered 2022-03-22 – 2022-03-23 (×3): 3 mL via RESPIRATORY_TRACT
  Filled 2022-03-22 (×3): qty 3

## 2022-03-22 MED ORDER — ALBUTEROL SULFATE HFA 108 (90 BASE) MCG/ACT IN AERS
4.0000 | INHALATION_SPRAY | Freq: Once | RESPIRATORY_TRACT | Status: DC
  Filled 2022-03-22: qty 6.7

## 2022-03-22 MED ORDER — IPRATROPIUM-ALBUTEROL 20-100 MCG/ACT IN AERS: 1.0000 | INHALATION_SPRAY | Freq: Four times a day (QID) | RESPIRATORY_TRACT | Status: DC | PRN

## 2022-03-22 MED ORDER — HYDROXYZINE HCL 25 MG PO TABS: 25.0000 mg | ORAL_TABLET | Freq: Four times a day (QID) | ORAL | Status: DC | PRN

## 2022-03-22 MED ORDER — BUDESONIDE 0.5 MG/2ML IN SUSP
0.2500 mg | Freq: Two times a day (BID) | RESPIRATORY_TRACT | Status: DC
  Administered 2022-03-22 – 2022-03-23 (×2): 0.25 mg via RESPIRATORY_TRACT
  Filled 2022-03-22 (×2): qty 2

## 2022-03-22 MED ORDER — SODIUM CHLORIDE 0.9 % IV SOLN
12.5000 mg | Freq: Once | INTRAVENOUS | Status: AC
  Administered 2022-03-22: 12.5 mg via INTRAVENOUS
  Filled 2022-03-22: qty 0.5

## 2022-03-22 MED ORDER — VITAMIN D (ERGOCALCIFEROL) 1.25 MG (50000 UNIT) PO CAPS: 50000.0000 [IU] | ORAL_CAPSULE | ORAL | Status: DC

## 2022-03-22 MED ORDER — PREDNISONE 20 MG PO TABS
40.0000 mg | ORAL_TABLET | Freq: Every day | ORAL | Status: DC
  Administered 2022-03-23: 40 mg via ORAL
  Filled 2022-03-22: qty 2

## 2022-03-22 MED ORDER — ORAL CARE MOUTH RINSE: 15.0000 mL | OROMUCOSAL | Status: DC | PRN

## 2022-03-22 MED ORDER — LACTATED RINGERS IV BOLUS
1000.0000 mL | Freq: Once | INTRAVENOUS | Status: AC
  Administered 2022-03-22: 1000 mL via INTRAVENOUS

## 2022-03-22 MED ORDER — PROMETHAZINE HCL 25 MG/ML IJ SOLN
INTRAMUSCULAR | Status: AC
  Filled 2022-03-22: qty 1

## 2022-03-22 NOTE — ED Provider Notes (Signed)
Patient [redacted] weeks pregnant here with 1 week of cough and shortness of breath.  Husband with recent URI.  Seen at [facility] 6 days ago where she had a negative flu swab as well as negative CTA chest.  Increased shortness of breath today with cough and chest pain.  Dyspneic with conversation, clear lungs with scattered wheezing.  Patient given bronchodilators and steroids.  Awaiting ambulation trial.  On ambulation patient's heart rate accelerates to 150 bpm and she becomes very short of breath but does not become hypoxic.  She reports no history of asthma.  In the 30s tachycardic to the 150s.  Lungs are clear.  Patient given additional DuoNeb.  Recent CT PE study was negative.  Given her persistent tachypnea and dyspnea on exertion we will plan admission for further evaluation and treatments for suspected bronchitis.  Discussed with Dr. Chipper Herb.  She has been cleared by OB.   Glynn Octave, MD 03/22/22 1751

## 2022-03-22 NOTE — Discharge Instructions (Signed)
Today you were seen in the emergency department for your shortness of breath.    In the emergency department you had a chest x-ray, fetal monitoring, and lab work that was reassuring.  You were given prednisone and albuterol.    At home, please take the steroids that we have prescribed you and the albuterol.    Check your MyChart online for the results of any tests that had not resulted by the time you left the emergency department.   Follow-up with your primary doctor in 2-3 days regarding your visit.    Return immediately to the emergency department if you experience any of the following: Difficulty breathing, chest pain, or any other concerning symptoms.    Thank you for visiting our Emergency Department. It was a pleasure taking care of you today.

## 2022-03-22 NOTE — ED Notes (Signed)
OB rapid response nurse called

## 2022-03-22 NOTE — Progress Notes (Signed)
Patient has appointment with Dr Clent Demark on Tuesday.  Keep appointment.  Call office with questions and concerns. Cleared by OB Service.  Marvell Fuller RNC-OB [facility] [phone]

## 2022-03-22 NOTE — ED Provider Notes (Signed)
[facility] EMERGENCY DEPARTMENT Provider Note   CSN: [id number] Arrival date & time: 03/22/22  1113     History  Chief Complaint  Patient presents with   Shortness of Breath    Melanie Meadows is a 26 y.o. female.  26 year old female at 4323 weeks pregnancy who presents emergency department with shortness of breath.  Approximately 1 week ago started experiencing cough and shortness of breath.  Husband had URI recently but it resolved quickly.  Says that they went to urgent care and then the [facility] emergency department where she was given a nebulizer treatment and had an extensive work-up including COVID and flu as well as a CTA of the chest for PE which was negative.  Was discharged home but was not given any medications.  Has been taking OTC cough drops.  This morning shortness of breath got worse and cough persisted so they decided to come into the emergency department for evaluation.  Has been trying an inhaler at home without significant improvement of her symptoms.  Also noticed decreased fetal movement over the past few days.  Had a home pulse ox as well which was reading in the 80s.       Home Medications Prior to Admission medications   Medication Sig Start Date End Date Taking? Authorizing Provider  albuterol (PROVENTIL HFA;VENTOLIN HFA) 108 (90 Base) MCG/ACT inhaler Inhale 2 puffs into the lungs every 6 (six) hours as needed for wheezing or shortness of breath. Patient not taking: Reported on 05/20/2018 10/07/17   Durwin Noraenney, Jeffrey M, MD  busPIRone (BUSPAR) 10 MG tablet TAKE 1 TABLET BY MOUTH TWICE A DAY 08/29/18   Anyanwu, Jethro BastosUgonna A, MD  calcium carbonate (TUMS - DOSED IN MG ELEMENTAL CALCIUM) 500 MG chewable tablet Chew 1 tablet by mouth as needed for indigestion or heartburn. Up to 6 times daily as needed.    [provider]  ferrous sulfate 325 (65 FE) MG tablet Take 1 tablet (325 mg total) by mouth 2 (two) times daily with a meal. 11/28/17   Dove,  Myra C, MD  hydrOXYzine (ATARAX/VISTARIL) 25 MG tablet Take 1-2 tablets (25-50 mg total) by mouth every 6 (six) hours as needed for anxiety. 08/07/18   Anyanwu, Jethro BastosUgonna A, MD  isoniazid (NYDRAZID) 300 MG tablet Take 1 tablet (300 mg total) by mouth daily. 03/20/21   Randall HissVan Dam, Cornelius N, MD  levonorgestrel (MIRENA) 20 MCG/24HR IUD 1 Intra Uterine Device (1 each total) by Intrauterine route once for 1 dose. 04/16/18 04/16/18  Reva BoresPratt, Tanya S, MD  magnesium oxide (MAGOX 400) 400 (241.3 Mg) MG tablet One pill by mouth as needed for headaches. Patient not taking: Reported on 05/20/2018 02/10/18   Turpin BingPickens, Charlie, MD  Misc. Devices (BREAST PUMP) MISC Dispense one breast pump for patient Patient not taking: Reported on 02/15/2021 02/25/18   Federico FlakeNewton, Kimberly Niles, MD  Peak Flow Meter DEVI Measure peak flow daily or if short of breath.  If <350cc (which is 80% of your expected), take 2 puffs of albuterol rescue inhaler and reassess in 20 mins.  If persistently <350 after 2 rescue attempts, present to hospital for evaluation. Patient not taking: Reported on 05/20/2018 10/07/17   Durwin Noraenney, Jeffrey M, MD  Prenatal Vit-Fe Fumarate-FA (PRENATAL MULTIVITAMIN) TABS tablet Take 1 tablet by mouth daily at 12 noon.    [provider]  propranolol (INDERAL) 10 MG tablet Take 10 mg by mouth daily. 01/15/21   [provider]  pyridOXINE (VITAMIN B-6) 50 MG  tablet Take 1 tablet (50 mg total) by mouth daily. 03/20/21   Randall Hiss, MD  Vitamin D, Ergocalciferol, (DRISDOL) 1.25 MG (50000 UNIT) CAPS capsule Take 50,000 Units by mouth once a week. 01/29/21   [provider]      Allergies    Pork-derived products    Review of Systems   Review of Systems  Physical Exam Updated Vital Signs BP 115/82   Pulse (!) 124   Temp 98.5 F (36.9 C) (Oral)   Resp 18   Wt 77.1 kg   LMP  (Exact Date)   SpO2 99%   BMI 27.44 kg/m  Physical Exam Vitals and nursing note reviewed.  Constitutional:       General: She is not in acute distress.    Appearance: She is well-developed.  HENT:     Head: Normocephalic and atraumatic.     Right Ear: External ear normal.     Left Ear: External ear normal.     Nose: Nose normal.     Mouth/Throat:     Pharynx: Posterior oropharyngeal erythema present. No oropharyngeal exudate.  Eyes:     Extraocular Movements: Extraocular movements intact.     Conjunctiva/sclera: Conjunctivae normal.     Pupils: Pupils are equal, round, and reactive to light.  Cardiovascular:     Rate and Rhythm: Normal rate and regular rhythm.     Heart sounds: No murmur heard. Pulmonary:     Effort: Pulmonary effort is normal. No respiratory distress.     Breath sounds: No stridor. Wheezing (Expiratory diffuse) present.     Comments: Able to speak in short sentences.  No accessory muscle use noted. Abdominal:     General: Abdomen is flat. There is no distension.     Palpations: Abdomen is soft. There is no mass.     Tenderness: There is no abdominal tenderness. There is no guarding.  Musculoskeletal:        General: No swelling.     Cervical back: Normal range of motion and neck supple.     Right lower leg: No edema.     Left lower leg: No edema.  Skin:    General: Skin is warm and dry.     Capillary Refill: Capillary refill takes less than 2 seconds.  Neurological:     Mental Status: She is alert and oriented to person, place, and time. Mental status is at baseline.  Psychiatric:        Mood and Affect: Mood normal.     ED Results / Procedures / Treatments   Labs (all labs ordered are listed, but only abnormal results are displayed) Labs Reviewed  BASIC METABOLIC PANEL - Abnormal; Notable for the following components:      Result Value   Sodium 134 (*)    CO2 21 (*)    BUN <5 (*)    Calcium 8.1 (*)    All other components within normal limits  CBC WITH DIFFERENTIAL/PLATELET - Abnormal; Notable for the following components:   WBC 16.9 (*)    Neutro Abs  13.6 (*)    Eosinophils Absolute 0.6 (*)    Abs Immature Granulocytes 0.08 (*)    All other components within normal limits  MAGNESIUM    EKG EKG Interpretation  Date/Time:  Friday March 22 2022 12:37:59 EST Ventricular Rate:  103 PR Interval:  109 QRS Duration: 75 QT Interval:  335 QTC Calculation: 439 R Axis:   79 Text Interpretation: Sinus tachycardia Confirmed  by Vonita Moss [phone]) on 03/22/2022 1:00:01 PM  Radiology DG Chest Port 1 View  Result Date: 03/22/2022 CLINICAL DATA:  Shortness of breath, wheezing. EXAM: PORTABLE CHEST 1 VIEW COMPARISON:  CT examination dated April 26, 2021 FINDINGS: The heart size and mediastinal contours are within normal limits. Both lungs are clear. The visualized skeletal structures are unremarkable. IMPRESSION: No active disease. Electronically Signed   By: Larose Hires D.O.   On: 03/22/2022 12:47    Procedures Procedures   Medications Ordered in ED Medications  albuterol (VENTOLIN HFA) 108 (90 Base) MCG/ACT inhaler 4 puff (4 puffs Inhalation Not Given 03/22/22 1331)  predniSONE (DELTASONE) tablet 60 mg (60 mg Oral Given 03/22/22 1227)  lactated ringers bolus 1,000 mL (1,000 mLs Intravenous New Bag/Given 03/22/22 1523)    ED Course/ Medical Decision Making/ A&P Clinical Course as of 03/22/22 1628  Fri Mar 22, 2022  1348 OB/GYN has reviewed monitoring and has cleared the patient. [RP]  1520 Getting additional albuterol neb dose now.  [RP]    Clinical Course User Index [RP] Rondel Baton, MD                           Medical Decision Making Amount and/or Complexity of Data Reviewed Labs: ordered. Radiology: ordered.  Risk Prescription drug management.   Melanie Meadows is a 26 y.o. female with comorbidities that complicate the patient evaluation including [redacted] weeks pregnant who presents with chief complaint of shortness of breath.  This patient presents to the ED for concern of complaints listed in HPI,  this involves an extensive number of treatment options, and is a complaint that carries with it a high risk of complications and morbidity. Disposition including potential need for admission considered.   Initial Ddx:  Asthma exacerbation, URI, pneumonia, PE, peripartum cardiomyopathy  MDM:  Feel the patient is likely having an asthma exacerbation given her prior improvement with nebulizer treatment and wheezing on exam today.  May have been incited by URI but has already had negative COVID and flu testing.  CTA did not show evidence of PE recently making it highly unlikely.  Considered pneumonia but also feel that this is less likely given her recent reassuring imaging.  No volume overload or Rales bibasilarly that would be concerning for peripartum cardiomyopathy.  Plan:  Labs DuoNeb Prednisone Chest x-ray Considered CTA and RVP testing but do not feel they are indicated with her recent negative test  ED Summary/Re-evaluation:  Patient reevaluated and was feeling much better after receiving 2 DuoNebs and an albuterol treatment.  Was tachycardic and with her tachypnea may have insensible losses so was given IV fluids.  Chest x-ray did not show evidence of pneumonia.  Lab work showed mild leukocytosis without other concerning findings.  Patient was monitored on toco since she was complaining of decreased fetal movement for several days but did not show any abnormalities and OB/GYN cleared her.  Patient was spaced to 2 hours but required an additional treatment.  Signed out to Dr. Manus Gunning or awaiting repeat evaluation.  Dispo: Pending remainder of workup   Additional history obtained from significant other Records reviewed ED Visit Notes The following labs were independently interpreted: Chemistry and show non-anion gap metabolic acidosis I independently reviewed the following imaging with scope of interpretation limited to determining acute life threatening conditions related to emergency  care: Chest x-ray, which revealed no acute abnormality  I personally reviewed and interpreted cardiac monitoring: sinus tachycardia  I personally reviewed and interpreted the pt's EKG: see above for interpretation  I have reviewed the patients home medications and made adjustments as needed  Final Clinical Impression(s) / ED Diagnoses Final diagnoses:  Wheezing  Pregnancy with 23 completed weeks gestation    Rx / DC Orders ED Discharge Orders     None         Rondel Baton, MD 03/22/22 [phone]

## 2022-03-22 NOTE — ED Notes (Signed)
Ambulated to restroom on r/a SpO2 97-100%. HR 130-157, some dizziness, continual NPC.

## 2022-03-22 NOTE — ED Notes (Signed)
Pt cleared by OB

## 2022-03-22 NOTE — ED Triage Notes (Signed)
Reports shortness of breath x 1 week , was seen for similar last week .  23 weeks preg , reports feels decreased fetal movement .

## 2022-03-22 NOTE — Progress Notes (Signed)
G3P1 at 23 3/7 weeks reports to [facility] with c/o SOB and decreased fetal movement.  Obtains PNC with Dr Arther Abbott in [facility]. Monitors applied for NST. Dr Manus Rudd and Dr Jaymes Graff on unit.  Updated on patient status.

## 2022-03-22 NOTE — H&P (Addendum)
PCP:   Darrol Jump, NP   Chief Complaint: Shortness of breath/wheeze   HPI: This is a 26 year old G3, P1 space 23-week pregnant female.  Approximately week ago her husband was ill with a respiratory virus.  For the past week she has developed shortness of breath, intractable coughing leading her to not be able to catch her breath.  Last week she went to [facility] where she had a CTA chest, RSV, flu/COVID done and they were all negative.  She was discharged with albuterol MDI and Roxicodone Despite these meds her symptom has been getting worse.  On Tuesday she was tested for strep which was negative.  Wednesday/Thursday her baby's movements were slowed.  She had to quite a bit stimulated the baby to move.  Her OB/GYN has advised her not to take any nighttime cough medications. Yesterday the same recurred, she spoke with her OB Gyn she was directed to go to the ER.    She went to [facility].  At [facility] she was noted to have wheezing on examination.  She was treated with bronchodilators and steroids.  She had ambulation trial where her heart rate increased to 150's and she became short of breath.  She did not become hypoxic.  The decision was made to transfer her to [facility] for further treatment.    Here at [facility], patient without significant wheeze but she has significant intractable cough.  Per patient patient's baby is moving normally at this time.  Patient states she has been medicating with Tylenol cold and flu, Mucinex, Benadryl, albuterol inhaler and cough drops.  History provided by the patient and her husband present at bedside  Review of Systems:  Positive: Nonproductive coughing, shortness of breath, wheezing Negative: No fever, chills, nausea, vomiting or diarrhea.  Past Medical History: Past Medical History:  Diagnosis Date   Anemia    Asthma    Bartholin's cyst 09/10/2017   Not-infected; have MD re-evaluate at next OB appt    Family history of ovarian cancer    PT CANDIDATE FOR GENETIC TESTING AT AGE 66   Headache    Immunization, viral disease    GARDASIL COMPLETED   Infection    UTI   Kidney stone    Latent tuberculosis    Ovarian cyst 05/2012   RUPTURED; SAW URGENT CARE   Tuberculosis    latent tb    Past Surgical History:  Procedure Laterality Date   NO PAST SURGERIES      Medications: Prior to Admission medications   Medication Sig Start Date End Date Taking? Authorizing Provider  predniSONE (DELTASONE) 20 MG tablet Take 2 tablets (40 mg total) by mouth daily with breakfast for 4 days. 03/22/22 03/26/22 Yes Fransico Meadow, MD  albuterol (VENTOLIN HFA) 108 (90 Base) MCG/ACT inhaler Inhale 2 puffs into the lungs every 6 (six) hours as needed for wheezing or shortness of breath. 03/22/22   Fransico Meadow, MD  busPIRone (BUSPAR) 10 MG tablet TAKE 1 TABLET BY MOUTH TWICE A DAY 08/29/18   Anyanwu, Sallyanne Havers, MD  calcium carbonate (TUMS - DOSED IN MG ELEMENTAL CALCIUM) 500 MG chewable tablet Chew 1 tablet by mouth as needed for indigestion or heartburn. Up to 6 times daily as needed.    [provider]  ferrous sulfate 325 (65 FE) MG tablet Take 1 tablet (325 mg total) by mouth 2 (two) times daily with a meal. 11/28/17   Emily Filbert, MD  hydrOXYzine (ATARAX/VISTARIL) 25 MG tablet Take 1-2 tablets (25-50 mg total) by mouth every 6 (six) hours as needed for anxiety. 08/07/18   Anyanwu, Sallyanne Havers, MD  isoniazid (NYDRAZID) 300 MG tablet Take 1 tablet (300 mg total) by mouth daily. 03/20/21   Truman Hayward, MD  levonorgestrel (MIRENA) 20 MCG/24HR IUD 1 Intra Uterine Device (1 each total) by Intrauterine route once for 1 dose. 04/16/18 04/16/18  Donnamae Jude, MD  magnesium oxide (MAGOX 400) 400 (241.3 Mg) MG tablet One pill by mouth as needed for headaches. Patient not taking: Reported on 05/20/2018 02/10/18   Aletha Halim, MD  Misc. Devices (BREAST PUMP) MISC Dispense one breast pump for  patient Patient not taking: Reported on 02/15/2021 02/25/18   Caren Macadam, MD  Peak Flow Meter DEVI Measure peak flow daily or if short of breath.  If <350cc (which is 80% of your expected), take 2 puffs of albuterol rescue inhaler and reassess in 20 mins.  If persistently <350 after 2 rescue attempts, present to hospital for evaluation. Patient not taking: Reported on 05/20/2018 10/07/17   Oralia Rud, MD  Prenatal Vit-Fe Fumarate-FA (PRENATAL MULTIVITAMIN) TABS tablet Take 1 tablet by mouth daily at 12 noon.    [provider]  propranolol (INDERAL) 10 MG tablet Take 10 mg by mouth daily. 01/15/21   [provider]  pyridOXINE (VITAMIN B-6) 50 MG tablet Take 1 tablet (50 mg total) by mouth daily. 03/20/21   Truman Hayward, MD  Vitamin D, Ergocalciferol, (DRISDOL) 1.25 MG (50000 UNIT) CAPS capsule Take 50,000 Units by mouth once a week. 01/29/21   [provider]    Allergies:   Allergies  Allergen Reactions   Pork-Derived Products Nausea Only, Swelling and Rash    Social History:  reports that she has never smoked. She has never used smokeless tobacco. She reports that she does not drink alcohol and does not use drugs.  Family History: Family History  Problem Relation Age of Onset   Cancer Paternal Grandmother     Physical Exam: Vitals:   03/22/22 1938 03/22/22 2007 03/22/22 2020 03/22/22 2340  BP:   124/74 106/79  Pulse:   (!) 124   Resp:   (!) 24   Temp:   98.2 F (36.8 C) 98.4 F (36.9 C)  TempSrc:   Oral Oral  SpO2: 99% 100% 96%   Weight:        General:  Alert and oriented times three, well developed and nourished, no acute distress Eyes: PERRLA, pink conjunctiva, no scleral icterus ENT: Moist oral mucosa, neck supple, no thyromegaly Lungs: clear to ascultation, no wheeze, no crackles, no use of accessory muscles Cardiovascular: regular rate and rhythm, no regurgitation, no gallops, no murmurs. No carotid bruits, no  JVD Abdomen: Gravid uterus, positive BS, non-tender, non-distended, no organomegaly, not an acute abdomen GU: not examined Neuro: CN II - XII grossly intact, sensation intact Musculoskeletal: strength 5/5 all extremities, no clubbing, cyanosis or edema Skin: no rash, no subcutaneous crepitation, no decubitus Psych: appropriate patient   Labs on Admission:  Recent Labs    03/22/22 1216  NA 134*  K 3.9  CL 106  CO2 21*  GLUCOSE 97  BUN <5*  CREATININE 0.51  CALCIUM 8.1*  MG 1.9    Recent Labs    03/22/22 1216  WBC 16.9*  NEUTROABS 13.6*  HGB 12.4  HCT 36.1  MCV 88.5  PLT 304    Micro Results: Recent  Results (from the past 240 hour(s))  SARS Coronavirus 2 by RT PCR (hospital order, performed in [facility] hospital lab) *cepheid single result test* Anterior Nasal Swab     Status: None   Collection Time: 03/22/22  5:10 PM   Specimen: Anterior Nasal Swab  Result Value Ref Range Status   SARS Coronavirus 2 by RT PCR NEGATIVE NEGATIVE Final    Comment: (NOTE) SARS-CoV-2 target nucleic acids are NOT DETECTED.  The SARS-CoV-2 RNA is generally detectable in upper and lower respiratory specimens during the acute phase of infection. The lowest concentration of SARS-CoV-2 viral copies this assay can detect is 250 copies / mL. A negative result does not preclude SARS-CoV-2 infection and should not be used as the sole basis for treatment or other patient management decisions.  A negative result may occur with improper specimen collection / handling, submission of specimen other than nasopharyngeal swab, presence of viral mutation(s) within the areas targeted by this assay, and inadequate number of viral copies (<250 copies / mL). A negative result must be combined with clinical observations, patient history, and epidemiological information.  Fact Sheet for Patients:   RoadLapTop.co.za  Fact Sheet for Healthcare  Providers: http://kim-miller.com/  This test is not yet approved or  cleared by the [place] FDA and has been authorized for detection and/or diagnosis of SARS-CoV-2 by FDA under an Emergency Use Authorization (EUA).  This EUA will remain in effect (meaning this test can be used) for the duration of the COVID-19 declaration under Section 564(b)(1) of the Act, 21 U.S.C. section 360bbb-3(b)(1), unless the authorization is terminated or revoked sooner.  Performed at [facility], [address] [place], [place] [address]      Radiological Exams on Admission: DG Chest [facility] 1 View  Result Date: 03/22/2022 CLINICAL DATA:  Shortness of breath, wheezing. EXAM: PORTABLE CHEST 1 VIEW COMPARISON:  CT examination dated April 26, 2021 FINDINGS: The heart size and mediastinal contours are within normal limits. Both lungs are clear. The visualized skeletal structures are unremarkable. IMPRESSION: No active disease. Electronically Signed   By: Larose Hires D.O.   On: 03/22/2022 12:47    Assessment/Plan Present on Admission:  Acute exacerbation of extrinsic asthma/intractable coughing. -Bring in for overnight observation med telemetry -P.o. prednisone, scheduled and as needed nebulizers ordered -Oxygen as needed -Mucinex as needed cough -Schedule Tessalon Perles   Gravid uterus at 23 weeks -Consult OB/GYN in a.m. -Continue prenatal vitamins  Melanie Meadows 03/22/2022, 11:56 PM

## 2022-03-23 DIAGNOSIS — J45901 Unspecified asthma with (acute) exacerbation: Secondary | ICD-10-CM | POA: Diagnosis not present

## 2022-03-23 MED ORDER — HYDROCOD POLI-CHLORPHE POLI ER 10-8 MG/5ML PO SUER: 5.0000 mL | Freq: Two times a day (BID) | ORAL | Status: DC | PRN

## 2022-03-23 MED ORDER — SODIUM CHLORIDE 0.9 % IN NEBU
3.0000 mL | INHALATION_SOLUTION | Freq: Three times a day (TID) | RESPIRATORY_TRACT | Status: DC | PRN
  Filled 2022-03-23: qty 3

## 2022-03-23 MED ORDER — BENZONATATE 100 MG PO CAPS: 100.0000 mg | ORAL_CAPSULE | Freq: Three times a day (TID) | ORAL | 0 refills | Status: AC | PRN

## 2022-03-23 MED ORDER — PREDNISONE 20 MG PO TABS: 40.0000 mg | ORAL_TABLET | Freq: Every day | ORAL | 0 refills | Status: AC

## 2022-03-23 MED ORDER — BENZONATATE 100 MG PO CAPS
100.0000 mg | ORAL_CAPSULE | Freq: Three times a day (TID) | ORAL | Status: DC
  Administered 2022-03-23: 100 mg via ORAL
  Filled 2022-03-23: qty 1

## 2022-03-23 MED ORDER — ONDANSETRON HCL 4 MG/2ML IJ SOLN
4.0000 mg | INTRAMUSCULAR | Status: DC | PRN
  Administered 2022-03-23: 4 mg via INTRAVENOUS
  Filled 2022-03-23: qty 2

## 2022-03-23 MED ORDER — GUAIFENESIN 100 MG/5ML PO LIQD
5.0000 mL | ORAL | Status: DC | PRN
  Administered 2022-03-23: 5 mL via ORAL
  Filled 2022-03-23: qty 5

## 2022-03-23 MED ORDER — ALBUTEROL SULFATE HFA 108 (90 BASE) MCG/ACT IN AERS: 2.0000 | INHALATION_SPRAY | RESPIRATORY_TRACT | 2 refills | Status: AC | PRN

## 2022-03-23 MED ORDER — IPRATROPIUM-ALBUTEROL 0.5-2.5 (3) MG/3ML IN SOLN: 3.0000 mL | RESPIRATORY_TRACT | Status: DC | PRN

## 2022-03-23 MED ORDER — FLUTICASONE PROPIONATE HFA 44 MCG/ACT IN AERO: 1.0000 | INHALATION_SPRAY | Freq: Two times a day (BID) | RESPIRATORY_TRACT | 0 refills | Status: AC

## 2022-03-23 NOTE — Plan of Care (Signed)
  Problem: Education: Goal: Knowledge of General Education information will improve Description: Including pain rating scale, medication(s)/side effects and non-pharmacologic comfort measures 03/23/2022 0945 by Waynette Buttery, RN Outcome: Adequate for Discharge 03/23/2022 0757 by Waynette Buttery, RN Outcome: Progressing   Problem: Health Behavior/Discharge Planning: Goal: Ability to manage health-related needs will improve 03/23/2022 0945 by Waynette Buttery, RN Outcome: Adequate for Discharge 03/23/2022 0757 by Waynette Buttery, RN Outcome: Progressing   Problem: Clinical Measurements: Goal: Ability to maintain clinical measurements within normal limits will improve 03/23/2022 0945 by Waynette Buttery, RN Outcome: Adequate for Discharge 03/23/2022 0757 by Waynette Buttery, RN Outcome: Progressing Goal: Will remain free from infection 03/23/2022 0945 by Waynette Buttery, RN Outcome: Adequate for Discharge 03/23/2022 0757 by Waynette Buttery, RN Outcome: Progressing Goal: Diagnostic test results will improve 03/23/2022 0945 by Waynette Buttery, RN Outcome: Adequate for Discharge 03/23/2022 0757 by Waynette Buttery, RN Outcome: Progressing Goal: Respiratory complications will improve 03/23/2022 0945 by Waynette Buttery, RN Outcome: Adequate for Discharge 03/23/2022 0757 by Waynette Buttery, RN Outcome: Progressing Goal: Cardiovascular complication will be avoided 03/23/2022 0945 by Waynette Buttery, RN Outcome: Adequate for Discharge 03/23/2022 0757 by Waynette Buttery, RN Outcome: Progressing   Problem: Activity: Goal: Risk for activity intolerance will decrease 03/23/2022 0945 by Waynette Buttery, RN Outcome: Adequate for Discharge 03/23/2022 0757 by Waynette Buttery, RN Outcome: Progressing   Problem: Nutrition: Goal: Adequate nutrition will be maintained 03/23/2022 0945 by Waynette Buttery, RN Outcome: Adequate for Discharge 03/23/2022 0757 by Waynette Buttery, RN Outcome: Progressing   Problem:  Coping: Goal: Level of anxiety will decrease 03/23/2022 0945 by Waynette Buttery, RN Outcome: Adequate for Discharge 03/23/2022 0757 by Waynette Buttery, RN Outcome: Progressing   Problem: Elimination: Goal: Will not experience complications related to bowel motility 03/23/2022 0945 by Waynette Buttery, RN Outcome: Adequate for Discharge 03/23/2022 0757 by Waynette Buttery, RN Outcome: Progressing Goal: Will not experience complications related to urinary retention 03/23/2022 0945 by Waynette Buttery, RN Outcome: Adequate for Discharge 03/23/2022 0757 by Waynette Buttery, RN Outcome: Progressing   Problem: Pain Managment: Goal: General experience of comfort will improve 03/23/2022 0945 by Waynette Buttery, RN Outcome: Adequate for Discharge 03/23/2022 0757 by Waynette Buttery, RN Outcome: Progressing   Problem: Safety: Goal: Ability to remain free from injury will improve 03/23/2022 0945 by Waynette Buttery, RN Outcome: Adequate for Discharge 03/23/2022 0757 by Waynette Buttery, RN Outcome: Progressing   Problem: Skin Integrity: Goal: Risk for impaired skin integrity will decrease 03/23/2022 0945 by Waynette Buttery, RN Outcome: Adequate for Discharge 03/23/2022 0757 by Waynette Buttery, RN Outcome: Progressing

## 2022-03-23 NOTE — Progress Notes (Signed)
Pt feels fetal movement. No vaginal bleeding or leaking of fluid. Denies uc's.

## 2022-03-23 NOTE — Consult Note (Signed)
     OBSTETRICS AND GYNECOLOGY ATTENDING TELEPHONE CONSULT NOTE   Consult Date: 03/23/2022   Reason for Consult: Acute asthma exacerbation in pregnancy, [redacted] weeks gestation Consulting Provider: Dr. Jeoffrey Massed   Consultation Details (from provider and chart review):  Melanie Meadows is a 26 y.o. G3P1011 at [redacted]w[redacted]d who gets her prenatal care with Dr. Shawnie Pons at [facility], admitted to the Hospitalist team for treatment of acute asthma exacerbation. No acute obstetric concerns.  Patient has been evaluated by our Rapid Response OB nursing, has reassuring fetal heart rate tracing.    I performed a chart review on the patient and reviewed available documentation.  BP 96/62 (BP Location: Right Arm)   Pulse 86   Temp 98.2 F (36.8 C) (Oral)   Resp 20   Wt 77.1 kg   LMP  (Exact Date)   SpO2 99%   BMI 27.44 kg/m   Exam performed by consulting provider and remarkable for gravid uterus, clear lungs, no respiratory distress  Scheduled Meds:  budesonide (PULMICORT) nebulizer solution  0.25 mg Nebulization BID   predniSONE  40 mg Oral Q breakfast   prenatal multivitamin  1 tablet Oral Q1200   promethazine       Continuous Infusions: PRN Meds:.calcium carbonate, guaiFENesin, ipratropium-albuterol, ondansetron (ZOFRAN) IV, mouth rinse, promethazine, sodium chloride  Pertinent labs and imaging:     Latest Ref Rng & Units 03/22/2022   12:16 PM 03/18/2018   11:09 AM 02/04/2018    4:42 PM  CBC  WBC 4.0 - 10.5 K/uL 16.9  15.3  13.3   Hemoglobin 12.0 - 15.0 g/dL 56.2  9.6  9.0   Hematocrit 36.0 - 46.0 % 36.1  31.1  26.9   Platelets 150 - 400 K/uL 304  262  295    DG Chest Port 1 View  Result Date: 03/22/2022 CLINICAL DATA:  Shortness of breath, wheezing. EXAM: PORTABLE CHEST 1 VIEW COMPARISON:  CT examination dated April 26, 2021 FINDINGS: The heart size and mediastinal contours are within normal limits. Both lungs are clear. The visualized skeletal structures are unremarkable.  IMPRESSION: No active disease. Electronically Signed   By: Larose Hires D.O.   On: 03/22/2022 12:47    Recommendations: -Patient's exacerbation is being managed on Pulmicort nebulizer treatments, Duonebs, Prednisone burst and albuterol as needed.  These medications can be used in pregnancy; can use the same medications that are used in a non-pregnant patient safely during pregnancy for asthma treatment.  - According to Dr. Jerral Ralph, she is improving and may be discharged today.  - We will continue daily doppler evaluations for fetal heart rate as planned until discharge.  -Recommended that the patient follow up with primary OB/GYN after discharge.   Thank you for this consult.  If additional recommendations are needed, please call [phone] [facility] OB/GYN Consult Attending Monday-Friday 8am - 5pm) or [phone] [facility] OB/GYN Attending On Call all day, every day).    I spent approximately 5 minutes directly consulting with the provider and verbally discussing this case. Additional 15 minutes was spent performing chart review and documentation.    Jaynie Collins, MD Obstetrician & Gynecologist, [facility] for [employer], [facility]

## 2022-03-23 NOTE — Progress Notes (Addendum)
Brief note Feels much better today! Not dyspneic-easily able to talk in full sentences. Has not coughed since 3 AM Able to take deep breaths without chest pain/coughing.  On exam: Lungs: Clear to auscultation  Assessment/plan Suspect acute bronchitis Since she has been using steroids/albuterol inhaler-suspect rapid improvement was from inhaled steroids. I will continue steroids/albuterol inhaler-add inhaled steroids for a few days on discharge. Discussed with OB MD on-call-no concerns on the part, recommends using standard bronchodilators/steroids medications (as if she was not pregnant).  Per OB MD-no concerns regarding fetus at this point.  See discharge summary for details.

## 2022-03-23 NOTE — Plan of Care (Signed)

## 2022-03-23 NOTE — Discharge Summary (Signed)
PATIENT DETAILS Name: Melanie Meadows Age: 26 y.o. Sex: female Date of Birth: 10-Mar-1996 MRN: [id number]. Admitting Physician: Gery Pray, MD [id number], Archie Patten, NP  Admit Date: 03/22/2022 Discharge date: 03/23/2022  Recommendations for Outpatient Follow-up:  Follow up with PCP in 1-2 weeks Please obtain CMP/CBC in one week  Admitted From:  Home  Disposition: Home   Discharge Condition: good  CODE STATUS:   Code Status: Prior   Diet recommendation:  Diet Order             Diet general           Diet regular Room service appropriate? Yes; Fluid consistency: Thin  Diet effective now                    Brief Summary: 26 year old-G3, P1 [redacted] weeks pregnant-who has been having cough/shortness of breath for several weeks-presented to med [facility] for decreased fetal movements.  She was evaluated by the OB team at [facility]-and felt to have reassuring fetal movement/heart sounds-but was found to be dyspneic and subsequently admitted to the [facility] service at [facility].  Per history-approximately a week or so ago-she was seen at [facility] for similar illness, spent 8 hours in the ED-received numerous bronchodilators-and a CTA chest that was negative for PE.  She was sent home but continued to have cough/shortness of breath.  Brief Hospital Course: Acute bronchitis Likely triggered by a viral syndrome Per prior notes-her husband had similar symptoms several weeks back. She is remarkably better after getting inhaled steroids/prednisone-she was using albuterol inhaler every 4 hours as an outpatient. Plan is to discharge her today-we will continue with inhaled steroid for a week or so, and continue with prednisone for a few more days.  Continue as needed albuterol inhaler.  Follow-up with her primary OB/GYN for posthospital discharge follow-up.  [redacted] weeks pregnant Reassuring fetal heart sounds OB team formally consulted this morning.  No  specific recommendations Continue prenatal vitamins Follow with the primary OB MD postdischarge  History of latent tuberculosis This was apparently 4 years back-she completed a course of INH.  Discharge Diagnoses:  Principal Problem:   Acute exacerbation of extrinsic asthma Active Problems:   TB lung, latent   Palpitations   Anxiety disorder   Asthma   Gravid uterus at 20 to 24 weeks size   Discharge Instructions:  Activity:  As tolerated   Discharge Instructions     Call MD for:  difficulty breathing, headache or visual disturbances   Complete by: As directed    Diet general   Complete by: As directed    Discharge instructions   Complete by: As directed    Follow with Primary MD  Adela Glimpse, NP in 1-2 weeks  Please get a complete blood count and chemistry panel checked by your Primary MD at your next visit, and again as instructed by your Primary MD.  Get Medicines reviewed and adjusted: Please take all your medications with you for your next visit with your Primary MD  Laboratory/radiological data: Please request your Primary MD to go over all hospital tests and procedure/radiological results at the follow up, please ask your Primary MD to get all Hospital records sent to his/her office.  In some cases, they will be blood work, cultures and biopsy results pending at the time of your discharge. Please request that your primary care M.D. follows up on these results.  Also Note the following: If you experience worsening of  your admission symptoms, develop shortness of breath, life threatening emergency, suicidal or homicidal thoughts you must seek medical attention immediately by calling 911 or calling your MD immediately  if symptoms less severe.  You must read complete instructions/literature along with all the possible adverse reactions/side effects for all the Medicines you take and that have been prescribed to you. Take any new Medicines after you have  completely understood and accpet all the possible adverse reactions/side effects.   Do not drive when taking Pain medications or sleeping medications (Benzodaizepines)  Do not take more than prescribed Pain, Sleep and Anxiety Medications. It is not advisable to combine anxiety,sleep and pain medications without talking with your primary care practitioner  Special Instructions: If you have smoked or chewed Tobacco  in the last 2 yrs please stop smoking, stop any regular Alcohol  and or any Recreational drug use.  Wear Seat belts while driving.  Please note: You were cared for by a hospitalist during your hospital stay. Once you are discharged, your primary care physician will handle any further medical issues. Please note that NO REFILLS for any discharge medications will be authorized once you are discharged, as it is imperative that you return to your primary care physician (or establish a relationship with a primary care physician if you do not have one) for your post hospital discharge needs so that they can reassess your need for medications and monitor your lab values.   Increase activity slowly   Complete by: As directed       Allergies as of 03/23/2022       Reactions   Pork-derived Products Nausea Only, Swelling, Rash        Medication List     STOP taking these medications    busPIRone 10 MG tablet Commonly known as: BUSPAR   Butalbital-APAP-Caffeine 50-300-40 MG Caps   cyclobenzaprine 10 MG tablet Commonly known as: FLEXERIL   pyridOXINE 50 MG tablet Commonly known as: VITAMIN B6   ROLAIDS PO   SUMAtriptan 50 MG tablet Commonly known as: IMITREX   triamcinolone cream 0.5 % Commonly known as: KENALOG       TAKE these medications    albuterol 108 (90 Base) MCG/ACT inhaler Commonly known as: VENTOLIN HFA Inhale 2 puffs into the lungs every 4 (four) hours as needed for wheezing or shortness of breath. What changed: when to take this   benzonatate 100  MG capsule Commonly known as: Tessalon Perles Take 1 capsule (100 mg total) by mouth 3 (three) times daily as needed for cough.   FIBER ADULT GUMMIES PO Take 3 each by mouth in the morning.   fluticasone 44 MCG/ACT inhaler Commonly known as: Flovent HFA Inhale 1 puff into the lungs 2 (two) times daily.   predniSONE 20 MG tablet Commonly known as: DELTASONE Take 2 tablets (40 mg total) by mouth daily with breakfast for 5 days.   prenatal multivitamin Tabs tablet Take 1 tablet by mouth in the morning.        Follow-up Information     Adela Glimpse, NP. Schedule an appointment as soon as possible for a visit in 1 week(s).   Specialties: Nurse Practitioner, Family Medicine Contact information: [address] [place] [address] [phone]                Allergies  Allergen Reactions   Pork-Derived Products Nausea Only, Swelling and Rash     Other Procedures/Studies: DG Chest Port 1 View  Result Date:  03/22/2022 CLINICAL DATA:  Shortness of breath, wheezing. EXAM: PORTABLE CHEST 1 VIEW COMPARISON:  CT examination dated April 26, 2021 FINDINGS: The heart size and mediastinal contours are within normal limits. Both lungs are clear. The visualized skeletal structures are unremarkable. IMPRESSION: No active disease. Electronically Signed   By: Larose HiresImran  Ahmed D.O.   On: 03/22/2022 12:47     TODAY-DAY OF DISCHARGE:  Subjective:   Melanie SessionsSarah Meadows today has no headache,no chest abdominal pain,no new weakness tingling or numbness, feels much better wants to go home today.   Objective:   Blood pressure 96/62, pulse 86, temperature 98.2 F (36.8 C), temperature source Oral, resp. rate 20, weight 77.1 kg, SpO2 99 %.  Intake/Output Summary (Last 24 hours) at 03/23/2022 0934 Last data filed at 03/22/2022 2248 Gross per 24 hour  Intake 50 ml  Output --  Net 50 ml   Filed Weights   03/22/22 1150  Weight: 77.1 kg    Exam: Awake Alert,  Oriented *3, No new F.N deficits, Normal affect [place].AT,PERRAL Supple Neck,No JVD, No cervical lymphadenopathy appriciated.  Symmetrical Chest wall movement, Good air movement bilaterally, CTAB RRR,No Gallops,Rubs or new Murmurs, No Parasternal Heave +ve B.Sounds, Abd Soft, Non tender, No organomegaly appriciated, No rebound -guarding or rigidity. No Cyanosis, Clubbing or edema, No new Rash or bruise   PERTINENT RADIOLOGIC STUDIES: DG Chest Port 1 View  Result Date: 03/22/2022 CLINICAL DATA:  Shortness of breath, wheezing. EXAM: PORTABLE CHEST 1 VIEW COMPARISON:  CT examination dated April 26, 2021 FINDINGS: The heart size and mediastinal contours are within normal limits. Both lungs are clear. The visualized skeletal structures are unremarkable. IMPRESSION: No active disease. Electronically Signed   By: Larose HiresImran  Ahmed D.O.   On: 03/22/2022 12:47     PERTINENT LAB RESULTS: CBC: Recent Labs    03/22/22 1216  WBC 16.9*  HGB 12.4  HCT 36.1  PLT 304   CMET CMP     Component Value Date/Time   NA 134 (L) 03/22/2022 1216   NA 137 11/21/2017 1037   K 3.9 03/22/2022 1216   CL 106 03/22/2022 1216   CO2 21 (L) 03/22/2022 1216   GLUCOSE 97 03/22/2022 1216   BUN <5 (L) 03/22/2022 1216   BUN 6 11/21/2017 1037   CREATININE 0.51 03/22/2022 1216   CALCIUM 8.1 (L) 03/22/2022 1216   PROT 8.0 03/20/2016 1708   ALBUMIN 4.2 03/20/2016 1708   AST 16 03/20/2016 1708   ALT 13 (L) 03/20/2016 1708   ALKPHOS 51 03/20/2016 1708   BILITOT 0.3 03/20/2016 1708   GFRNONAA >60 03/22/2022 1216   GFRAA 161 11/21/2017 1037    GFR CrCl cannot be calculated (Unknown ideal weight.). No results for input(s): "LIPASE", "AMYLASE" in the last 72 hours. No results for input(s): "CKTOTAL", "CKMB", "CKMBINDEX", "TROPONINI" in the last 72 hours. Invalid input(s): "POCBNP" No results for input(s): "DDIMER" in the last 72 hours. No results for input(s): "HGBA1C" in the last 72 hours. No results for input(s):  "CHOL", "HDL", "LDLCALC", "TRIG", "CHOLHDL", "LDLDIRECT" in the last 72 hours. No results for input(s): "TSH", "T4TOTAL", "T3FREE", "THYROIDAB" in the last 72 hours.  Invalid input(s): "FREET3" No results for input(s): "VITAMINB12", "FOLATE", "FERRITIN", "TIBC", "IRON", "RETICCTPCT" in the last 72 hours. Coags: No results for input(s): "INR" in the last 72 hours.  Invalid input(s): "PT" Microbiology: Recent Results (from the past 240 hour(s))  SARS Coronavirus 2 by RT PCR (hospital order, [facility]) *cepheid single result test* Anterior  Nasal Swab     Status: None   Collection Time: 03/22/22  5:10 PM   Specimen: Anterior Nasal Swab  Result Value Ref Range Status   SARS Coronavirus 2 by RT PCR NEGATIVE NEGATIVE Final    Comment: (NOTE) SARS-CoV-2 target nucleic acids are NOT DETECTED.  The SARS-CoV-2 RNA is generally detectable in upper and lower respiratory specimens during the acute phase of infection. The lowest concentration of SARS-CoV-2 viral copies this assay can detect is 250 copies / mL. A negative result does not preclude SARS-CoV-2 infection and should not be used as the sole basis for treatment or other patient management decisions.  A negative result may occur with improper specimen collection / handling, submission of specimen other than nasopharyngeal swab, presence of viral mutation(s) within the areas targeted by this assay, and inadequate number of viral copies (<250 copies / mL). A negative result must be combined with clinical observations, patient history, and epidemiological information.  Fact Sheet for Patients:   RoadLapTop.co.za  Fact Sheet for Healthcare Providers: http://kim-miller.com/  This test is not yet approved or  cleared by the [place] FDA and has been authorized for detection and/or diagnosis of SARS-CoV-2 by FDA under an Emergency Use Authorization (EUA).  This EUA  will remain in effect (meaning this test can be used) for the duration of the COVID-19 declaration under Section 564(b)(1) of the Act, 21 U.S.C. section 360bbb-3(b)(1), unless the authorization is terminated or revoked sooner.  Performed at [facility], [address] [place], [place] [address]     FURTHER DISCHARGE INSTRUCTIONS:  Get Medicines reviewed and adjusted: Please take all your medications with you for your next visit with your Primary MD  Laboratory/radiological data: Please request your Primary MD to go over all hospital tests and procedure/radiological results at the follow up, please ask your Primary MD to get all Hospital records sent to his/her office.  In some cases, they will be blood work, cultures and biopsy results pending at the time of your discharge. Please request that your primary care M.D. goes through all the records of your hospital data and follows up on these results.  Also Note the following: If you experience worsening of your admission symptoms, develop shortness of breath, life threatening emergency, suicidal or homicidal thoughts you must seek medical attention immediately by calling 911 or calling your MD immediately  if symptoms less severe.  You must read complete instructions/literature along with all the possible adverse reactions/side effects for all the Medicines you take and that have been prescribed to you. Take any new Medicines after you have completely understood and accpet all the possible adverse reactions/side effects.   Do not drive when taking Pain medications or sleeping medications (Benzodaizepines)  Do not take more than prescribed Pain, Sleep and Anxiety Medications. It is not advisable to combine anxiety,sleep and pain medications without talking with your primary care practitioner  Special Instructions: If you have smoked or chewed Tobacco  in the last 2 yrs please stop smoking, stop any regular Alcohol  and or any  Recreational drug use.  Wear Seat belts while driving.  Please note: You were cared for by a hospitalist during your hospital stay. Once you are discharged, your primary care physician will handle any further medical issues. Please note that NO REFILLS for any discharge medications will be authorized once you are discharged, as it is imperative that you return to your primary care physician (or establish a relationship with a primary  care physician if you do not have one) for your post hospital discharge needs so that they can reassess your need for medications and monitor your lab values.  Total Time spent coordinating discharge including counseling, education and face to face time equals greater than 30 minutes.  SignedJeoffrey Massed 03/23/2022 9:34 AM

## 2022-03-23 NOTE — Progress Notes (Signed)
Patient arrived on unit via EMS. Alert and oriented x4. Non productive cough noted. No other distress noted. Patient settled into room, assessment completed. Husband at bedside. Will continue to monitor.
